# Patient Record
Sex: Male | Born: 1970 | Race: Black or African American | Hispanic: No | Smoking: Never smoker
Health system: Southern US, Community
[De-identification: ages and names within clinical notes are randomized; demographics above are authoritative.]

## PROBLEM LIST (undated history)

## (undated) DIAGNOSIS — K200 Eosinophilic esophagitis: Secondary | ICD-10-CM

## (undated) DIAGNOSIS — R1310 Dysphagia, unspecified: Secondary | ICD-10-CM

## (undated) DIAGNOSIS — G4730 Sleep apnea, unspecified: Secondary | ICD-10-CM

## (undated) DIAGNOSIS — K2970 Gastritis, unspecified, without bleeding: Secondary | ICD-10-CM

## (undated) HISTORY — DX: Gastritis, unspecified, without bleeding: K29.70

## (undated) HISTORY — DX: Sleep apnea, unspecified: G47.30

---

## 2004-11-09 ENCOUNTER — Emergency Department (HOSPITAL_COMMUNITY): Admission: EM | Admit: 2004-11-09 | Discharge: 2004-11-09 | Payer: Self-pay | Admitting: Family Medicine

## 2005-09-17 ENCOUNTER — Encounter: Admission: RE | Admit: 2005-09-17 | Discharge: 2005-09-17 | Payer: Self-pay | Admitting: Occupational Medicine

## 2007-09-13 ENCOUNTER — Emergency Department (HOSPITAL_COMMUNITY): Admission: EM | Admit: 2007-09-13 | Discharge: 2007-09-13 | Payer: Self-pay | Admitting: Emergency Medicine

## 2007-10-02 ENCOUNTER — Ambulatory Visit (HOSPITAL_COMMUNITY): Admission: RE | Admit: 2007-10-02 | Discharge: 2007-10-02 | Payer: Self-pay | Admitting: Chiropractic Medicine

## 2015-08-12 ENCOUNTER — Encounter (HOSPITAL_COMMUNITY): Payer: Self-pay | Admitting: *Deleted

## 2015-08-12 ENCOUNTER — Emergency Department (HOSPITAL_COMMUNITY): Payer: BLUE CROSS/BLUE SHIELD

## 2015-08-12 DIAGNOSIS — K529 Noninfective gastroenteritis and colitis, unspecified: Secondary | ICD-10-CM | POA: Insufficient documentation

## 2015-08-12 DIAGNOSIS — R05 Cough: Secondary | ICD-10-CM | POA: Insufficient documentation

## 2015-08-12 DIAGNOSIS — R1030 Lower abdominal pain, unspecified: Secondary | ICD-10-CM | POA: Diagnosis present

## 2015-08-12 DIAGNOSIS — R000 Tachycardia, unspecified: Secondary | ICD-10-CM | POA: Diagnosis not present

## 2015-08-12 LAB — BASIC METABOLIC PANEL WITH GFR
Anion gap: 12 (ref 5–15)
BUN: 15 mg/dL (ref 6–20)
CO2: 20 mmol/L — ABNORMAL LOW (ref 22–32)
Calcium: 9.5 mg/dL (ref 8.9–10.3)
Chloride: 108 mmol/L (ref 101–111)
Creatinine, Ser: 1.03 mg/dL (ref 0.61–1.24)
GFR calc Af Amer: >60 mL/min
GFR calc non Af Amer: >60 mL/min
Glucose, Bld: 112 mg/dL — ABNORMAL HIGH (ref 65–99)
Potassium: 4.1 mmol/L (ref 3.5–5.1)
Sodium: 140 mmol/L (ref 135–145)

## 2015-08-12 LAB — CBC
HCT: 47.7 % (ref 39.0–52.0)
Hemoglobin: 15.6 g/dL (ref 13.0–17.0)
MCH: 28.5 pg (ref 26.0–34.0)
MCHC: 32.7 g/dL (ref 30.0–36.0)
MCV: 87 fL (ref 78.0–100.0)
PLATELETS: 263 10*3/uL (ref 150–400)
RBC: 5.48 MIL/uL (ref 4.22–5.81)
RDW: 13.5 % (ref 11.5–15.5)
WBC: 8.3 10*3/uL (ref 4.0–10.5)

## 2015-08-12 LAB — I-STAT TROPONIN, ED: Troponin i, poc: 0 ng/mL (ref 0.00–0.08)

## 2015-08-12 LAB — I-STAT CG4 LACTIC ACID, ED: LACTIC ACID, VENOUS: 1.03 mmol/L (ref 0.5–2.0)

## 2015-08-12 NOTE — ED Notes (Signed)
Pt states started having diarrhea and lower abdominal pain, then vomiting, then coughing and wheezing.  Pt reports chest pain more with vomiting.  HR resting ins 123

## 2015-08-13 ENCOUNTER — Emergency Department (HOSPITAL_COMMUNITY)
Admission: EM | Admit: 2015-08-13 | Discharge: 2015-08-13 | Disposition: A | Discharge status: Home / Self Care | Payer: BLUE CROSS/BLUE SHIELD | Attending: Emergency Medicine | Admitting: Emergency Medicine

## 2015-08-13 DIAGNOSIS — K529 Noninfective gastroenteritis and colitis, unspecified: Secondary | ICD-10-CM

## 2015-08-13 LAB — I-STAT CG4 LACTIC ACID, ED: Lactic Acid, Venous: 0.82 mmol/L (ref 0.5–2.0)

## 2015-08-13 MED ORDER — ONDANSETRON HCL 4 MG/2ML IJ SOLN
4.0000 mg | Freq: Once | INTRAMUSCULAR | Status: AC
  Administered 2015-08-13: 4 mg via INTRAVENOUS
  Filled 2015-08-13: qty 2

## 2015-08-13 MED ORDER — LOPERAMIDE HCL 2 MG PO CAPS
4.0000 mg | ORAL_CAPSULE | Freq: Once | ORAL | Status: AC
  Administered 2015-08-13: 4 mg via ORAL
  Filled 2015-08-13: qty 2

## 2015-08-13 MED ORDER — LOPERAMIDE HCL 2 MG PO CAPS: 4.0000 mg | ORAL_CAPSULE | Freq: Once | ORAL | Status: DC

## 2015-08-13 MED ORDER — SODIUM CHLORIDE 0.9 % IV BOLUS (SEPSIS)
1000.0000 mL | Freq: Once | INTRAVENOUS | Status: AC
  Administered 2015-08-13: 1000 mL via INTRAVENOUS

## 2015-08-13 MED ORDER — ONDANSETRON 4 MG PO TBDP: 4.0000 mg | ORAL_TABLET | Freq: Three times a day (TID) | ORAL | Status: DC | PRN

## 2015-08-13 NOTE — ED Provider Notes (Signed)
CSN: [id number]     Arrival date & time 08/12/15  1720 History   By signing my name below, I, Arlan OrganAshley Leger, attest that this documentation has been prepared under the direction and in the presence of Shon Batonourtney F Elke Holtry, MD.  Electronically Signed: Arlan OrganAshley Leger, ED Scribe. 08/13/2015. 1:19 AM.   Chief Complaint  Patient presents with  . Abdominal Pain  . Diarrhea  . Emesis  . Chest Pain   The history is provided by the patient. No language interpreter was used.    HPI Comments: Tonette LedererShawn Mishra is a 45 y.o. male without any pertinent past medical history who presents to the Emergency Department complaining of constant, ongoing lower abdominal pain onset last night. Pain is described at tightness/cramping. He also reports nausea, vomiting, cough, and wheezing. Last episode of nausea/diarrhea earlier this afternoon. No aggravating or alleviating factors reported. No OTC medications or home remedies attempted prior to arrival. No recent fever, chills, chest pain, or shortness of breath. Pt was evaluated at Urgent Care earlier for same. He was discharged and advised to come to the Emergency Department as his HR was high. Pt denies any known sick contacts.   PCP: No PCP Per Patient    History reviewed. No pertinent past medical history. History reviewed. No pertinent past surgical history. No family history on file. Social History  Substance Use Topics  . Smoking status: Never Smoker   . Smokeless tobacco: None  . Alcohol Use: Yes     Comment: occ    Review of Systems  Constitutional: Negative for fever and chills.  Respiratory: Positive for cough. Negative for wheezing.   Cardiovascular: Negative for chest pain.  Gastrointestinal: Positive for nausea, vomiting, abdominal pain and diarrhea.  Neurological: Negative for headaches.  Psychiatric/Behavioral: Negative for confusion.  All other systems reviewed and are negative.     Allergies  Other  Home Medications   Prior to Admission  medications   Medication Sig Start Date End Date Taking? Authorizing Provider  loperamide (IMODIUM) 2 MG capsule Take 2 capsules (4 mg total) by mouth once. 08/13/15   Shon Batonourtney F Ileane Sando, MD  ondansetron (ZOFRAN ODT) 4 MG disintegrating tablet Take 1 tablet (4 mg total) by mouth every 8 (eight) hours as needed for nausea or vomiting. 08/13/15   Shon Batonourtney F Derelle Cockrell, MD   Triage Vitals: BP 125/81 mmHg  Pulse 81  Temp(Src) 100 F (37.8 C) (Oral)  Resp 17  SpO2 98%   Physical Exam  Constitutional: He is oriented to person, place, and time. He appears well-developed and well-nourished. No distress.  HENT:  Head: Normocephalic and atraumatic.  Cardiovascular: Normal rate, regular rhythm and normal heart sounds.   No murmur heard. Pulmonary/Chest: Effort normal and breath sounds normal. No respiratory distress. He has no wheezes.  Abdominal: Soft. Bowel sounds are normal. There is no tenderness. There is no rebound.  Musculoskeletal: He exhibits no edema.  Neurological: He is alert and oriented to person, place, and time.  Skin: Skin is warm and dry.  Psychiatric: He has a normal mood and affect.  Nursing note and vitals reviewed.   ED Course  Procedures (including critical care time)  DIAGNOSTIC STUDIES: Oxygen Saturation is 96% on RA, adequate by my interpretation.    COORDINATION OF CARE: 1:14 AM- Will give fluids. Will order CXR, i-stat CG4 lactic acid, BMP, CBC, I-stat troponin, and EKG. Discussed treatment plan with pt at bedside and pt agreed to plan.     Labs Review Labs Reviewed  BASIC METABOLIC PANEL - Abnormal; Notable for the following:    CO2 20 (*)    Glucose, Bld 112 (*)    All other components within normal limits  CBC  I-STAT TROPOININ, ED  I-STAT CG4 LACTIC ACID, ED  I-STAT CG4 LACTIC ACID, ED    Imaging Review Dg Chest 2 View  08/12/2015  CLINICAL DATA:  Chest pain EXAM: CHEST  2 VIEW COMPARISON:  None. FINDINGS: Normal heart size. Lungs are under aerated with  bibasilar atelectasis. No pneumothorax. No pleural effusion. IMPRESSION: Bibasilar atelectasis. Electronically Signed   By: Jolaine Click M.D.   On: 08/12/2015 18:19   I have personally reviewed and evaluated these images and lab results as part of my medical decision-making.   EKG Interpretation   Date/Time:  Tuesday August 12 2015 17:28:59 EDT Ventricular Rate:  123 PR Interval:  120 QRS Duration: 80 QT Interval:  296 QTC Calculation: 423 R Axis:   66 Text Interpretation:  Sinus tachycardia Otherwise normal ECG Confirmed by  Lincoln Brigham [id number]) on 08/12/2015 5:36:25 PM      MDM   Final diagnoses:  Gastroenteritis    Patient presents with vomiting, diarrhea, and abdominal pain. Endorse chest pain earlier but denies at this time. EKG was sinus tachycardia and plain films negative. Was noted to be tachycardic initially but improved on my evaluation. He is currently nontoxic. No focal abdominal tenderness. Given vomiting and diarrhea, suspect gastroenteritis with subsequent dehydration. Does have a low-grade temperature of 100.1. Basic labwork is reassuring. Patient was given fluids, Zofran, and Imodium. He reports improvement of his symptoms. He is able to tolerate fluids. Will discharge him with symptom control.  After history, exam, and medical workup I feel the patient has been appropriately medically screened and is safe for discharge home. Pertinent diagnoses were discussed with the patient. Patient was given return precautions.  I personally performed the services described in this documentation, which was scribed in my presence. The recorded information has been reviewed and is accurate.    Shon Baton, MD 08/13/15 [phone]

## 2015-08-13 NOTE — ED Notes (Signed)
Pt left with all his belongings and ambulated out of the treatment area.  

## 2015-08-13 NOTE — Discharge Instructions (Signed)

## 2016-02-19 DIAGNOSIS — M545 Low back pain, unspecified: Secondary | ICD-10-CM | POA: Insufficient documentation

## 2017-06-24 ENCOUNTER — Encounter: Payer: Self-pay | Admitting: Gastroenterology

## 2017-07-21 NOTE — Progress Notes (Signed)
[facility] Consult Note:  HistoryPepper Glover 07/22/2017  Referring physician: Bernadette Hoit, MD  Reason for consult/chief complaint: Dysphagia (onset x years, solids, has gotten choked.)   Subjective  HPI:  This patient was referred by primary care for about 10 years of intermittent solid food dysphagia. It occurs several times a week, usually meat will feel stuck in the neck and not to bring it back up.  It does not occur with liquids, but occasionally just with pills such as ibuprofen.  He denies any chronic allergic symptoms or skin rash.  He denies nausea, vomiting, early satiety, weight loss, change in bowel habits or rectal bleeding  He also rarely gets heartburn, and only then with certain offending foods. ROS:  Review of Systems  Constitutional: Negative for appetite change and unexpected weight change.  HENT: Negative for mouth sores and voice change.   Eyes: Negative for pain and redness.  Respiratory: Negative for cough and shortness of breath.   Cardiovascular: Negative for chest pain and palpitations.  Genitourinary: Negative for dysuria and hematuria.  Musculoskeletal: Negative for arthralgias and myalgias.  Skin: Negative for pallor and rash.  Neurological: Negative for weakness and headaches.  Hematological: Negative for adenopathy.     Past Medical History: History reviewed. No pertinent past medical history.  No chronic medical problems  Past Surgical History: Past Surgical History:  Procedure Laterality Date  . NO PAST SURGERIES       Family History: Family History  Problem Relation Age of Onset  . Pancreatic cancer Maternal Grandmother     Social History: Social History   Socioeconomic History  . Marital status: Married    Spouse name: None  . Number of children: 2  . Years of education: None  . Highest education level: None  Social Needs  . Financial resource strain: None  . Food insecurity - worry: None  .  Food insecurity - inability: None  . Transportation needs - medical: None  . Transportation needs - non-medical: None  Occupational History  . 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 Smoking status: Never Smoker  . Smokeless tobacco: Never Used  Substance and Sexual Activity  . Alcohol use: Yes    Comment: occ  . Drug use: No  . Sexual activity: None  Other Topics Concern  . None  Social History Narrative  . None    Allergies: Allergies  Allergen Reactions  . Other Anaphylaxis    Fresh fish, can't breath    Outpatient Meds: No current outpatient medications on file.   No current facility-administered medications for this visit.       ___________________________________________________________________ Objective   Exam:  BP 130/72   Pulse 82   Ht 6' (1.829 m)   Wt 241 lb (109.3 kg)   BMI 32.69 kg/m    General: this is a(n) well-appearing man, normal vocal quality  Eyes: sclera anicteric, no redness  ENT: oral mucosa moist without lesions, no cervical or supraclavicular lymphadenopathy, good dentition  CV: RRR without murmur, S1/S2, no JVD, no peripheral edema  Resp: clear to auscultation bilaterally, normal RR and effort noted  GI: soft, no tenderness, with active bowel sounds. No guarding or palpable organomegaly noted.  Skin; warm and dry, no rash or jaundice noted  Neuro: awake, alert and oriented x 3. Normal gross motor function and fluent speech  Labs:  No data for review  Assessment: Encounter Diagnosis  Name Primary?  . Esophageal dysphagia Yes  This sounds most likely to be a mechanical cause of dysphagia.  I recommended upper endoscopy with probable dilation, and he is agreeable. Eosinophilic esophagitis or motility disorder seem less likely.   Thank you for the courtesy of this consult.  Please call me with any questions or concerns.  Jeffrey Glover  CC: Bernadette Hoitafaela Aguiar, MD

## 2017-07-22 ENCOUNTER — Ambulatory Visit (INDEPENDENT_AMBULATORY_CARE_PROVIDER_SITE_OTHER): Payer: BLUE CROSS/BLUE SHIELD | Admitting: Gastroenterology

## 2017-07-22 ENCOUNTER — Encounter: Payer: Self-pay | Admitting: Gastroenterology

## 2017-07-22 VITALS — BP 130/72 | HR 82 | Ht 72.0 in | Wt 241.0 lb

## 2017-07-22 DIAGNOSIS — R1319 Other dysphagia: Secondary | ICD-10-CM

## 2017-07-22 DIAGNOSIS — R1310 Dysphagia, unspecified: Secondary | ICD-10-CM

## 2017-07-22 NOTE — Patient Instructions (Signed)
If you are age 46 or older, your body mass index should be between 23-30. Your Body mass index is 32.69 kg/m. If this is out of the aforementioned range listed, please consider follow up with your Primary Care Provider.  If you are age 46 or younger, your body mass index should be between 19-25. Your Body mass index is 32.69 kg/m. If this is out of the aformentioned range listed, please consider follow up with your Primary Care Provider.   You have been scheduled for an endoscopy. Please follow written instructions given to you at your visit today. If you use inhalers (even only as needed), please bring them with you on the day of your procedure. Your physician has requested that you go to www.startemmi.com and enter the access code given to you at your visit today. This web site gives a general overview about your procedure. However, you should still follow specific instructions given to you by our office regarding your preparation for the procedure.  Thank you for choosing [facility]  Dr Amada JupiterHenry Danis III

## 2017-07-25 ENCOUNTER — Encounter: Payer: Self-pay | Admitting: Gastroenterology

## 2017-08-03 ENCOUNTER — Telehealth: Payer: Self-pay | Admitting: Gastroenterology

## 2017-08-03 ENCOUNTER — Ambulatory Visit (AMBULATORY_SURGERY_CENTER): Payer: BLUE CROSS/BLUE SHIELD | Admitting: Gastroenterology

## 2017-08-03 ENCOUNTER — Encounter: Payer: Self-pay | Admitting: Gastroenterology

## 2017-08-03 VITALS — BP 151/103 | HR 94 | Temp 99.6°F | Resp 16 | Ht 72.0 in | Wt 241.0 lb

## 2017-08-03 DIAGNOSIS — K209 Esophagitis, unspecified: Secondary | ICD-10-CM | POA: Diagnosis not present

## 2017-08-03 DIAGNOSIS — R1310 Dysphagia, unspecified: Secondary | ICD-10-CM | POA: Diagnosis present

## 2017-08-03 DIAGNOSIS — K228 Other specified diseases of esophagus: Secondary | ICD-10-CM

## 2017-08-03 DIAGNOSIS — R1319 Other dysphagia: Secondary | ICD-10-CM

## 2017-08-03 HISTORY — PX: UPPER GI ENDOSCOPY: SHX6162

## 2017-08-03 MED ORDER — SODIUM CHLORIDE 0.9 % IV SOLN: 500.0000 mL | Freq: Once | INTRAVENOUS | Status: DC

## 2017-08-03 NOTE — Op Note (Signed)
[facility] Patient Name: Jeffrey Glover Procedure Date: 08/03/2017 2:50 PM MRN: [id number] Endoscopist: Sherilyn Cooter L. Myrtie Neither , MD Age: 47 Referring MD:  Date of Birth: [birth year] Gender: Male Account #: [id number] Procedure:                Upper GI endoscopy Indications:              Dysphagia Medicines:                Monitored Anesthesia Care Procedure:                Pre-Anesthesia Assessment:                           - Prior to the procedure, a History and Physical                            was performed, and patient medications and                            allergies were reviewed. The patient's tolerance of                            previous anesthesia was also reviewed. The risks                            and benefits of the procedure and the sedation                            options and risks were discussed with the patient.                            All questions were answered, and informed consent                            was obtained. Prior Anticoagulants: The patient has                            taken no previous anticoagulant or antiplatelet                            agents. ASA Grade Assessment: II - A patient with                            mild systemic disease. After reviewing the risks                            and benefits, the patient was deemed in                            satisfactory condition to undergo the procedure.                           After obtaining informed consent, the endoscope was  passed under direct vision. Throughout the                            procedure, the patient's blood pressure, pulse, and                            oxygen saturations were monitored continuously. The                            Model GIF-[id number] scope was introduced                            through the mouth, and advanced to the upper third                            of esophagus. The upper GI endoscopy was  performed                            with difficulty due to narrowing. The patient                            tolerated the procedure well. Scope In: Scope Out: Findings:                 The larynx was normal.                           Mucosal changes including ringed esophagus, feline                            appearance, longitudinal furrows and small-caliber                            esophagus were found in the upper third of the                            esophagus. Biopsies were taken with a cold forceps                            for histology. The standard adult EGD scope (9.76mm                            diameter) could not be advanced this point due to                            the degree of narrowing. Complications:            No immediate complications. Estimated Blood Loss:     Estimated blood loss was minimal. Impression:               - Normal larynx.                           - Esophageal mucosal changes suggestive of  eosinophilic esophagitis. Biopsied. Recommendation:           - Await pathology results.                           - Perform an esophagram at appointment to be                            scheduled.                           - Return to my office after studies are complete.                           - Soft diet.                           - Continue present medications.                           - Patient has a contact number available for                            emergencies. The signs and symptoms of potential                            delayed complications were discussed with the                            patient. Return to normal activities tomorrow.                            Written discharge instructions were provided to the                            patient. Xayne Brumbaugh L. Myrtie Neitheranis, MD 08/03/2017 3:08:31 PM This report has been signed electronically.

## 2017-08-03 NOTE — Progress Notes (Signed)
Called to room to assist during endoscopic procedure.  Patient ID and intended procedure confirmed with present staff. Received instructions for my participation in the procedure from the performing physician.  

## 2017-08-03 NOTE — Patient Instructions (Addendum)
YOU HAD AN ENDOSCOPIC PROCEDURE TODAY AT THE [facility]:   Refer to the procedure report that was given to you for any specific questions about what was found during the examination.  If the procedure report does not answer your questions, please call your gastroenterologist to clarify.  If you requested that your care partner not be given the details of your procedure findings, then the procedure report has been included in a sealed envelope for you to review at your convenience later.   Following upper endoscopy (EGD)  Vomiting of blood or coffee ground material  New chest pain or pain under the shoulder blades  Painful or persistently difficult swallowing  New shortness of breath  Fever of 100F or higher  Black, tarry-looking stools  For urgent or emergent issues, a gastroenterologist can be reached at any hour by calling [phone].    Please see handouts given to you on Stricture and Soft diet. For urgent or emergent issues, a gastroenterologist can be reached at any hour by calling [phone].   DIET:  Dr. Myrtie Neither wants you to follow a soft diet for now.  Drink plenty of fluids but you should avoid alcoholic beverages for 24 hours.  ACTIVITY:  You should plan to take it easy for the rest of today and you should NOT DRIVE or use heavy machinery until tomorrow (because of the sedation medicines used during the test).    FOLLOW UP: Our staff will call the number listed on your records the next business day following your procedure to check on you and address any questions or concerns that you may have regarding the information given to you following your procedure. If we do not reach you, we will leave a message.  However, if you are feeling well and you are not experiencing any problems, there is no need to return our call.  We will assume that you have returned to your regular daily activities without incident.  If any biopsies were taken you will be contacted by  phone or by letter within the next 1-3 weeks.  Please call us at [phone] if you have not heard about the biopsies in 3 weeks.    SIGNATURES/CONFIDENTIALITY: You and/or your care partner have signed paperwork which will be entered into your electronic medical record.  These signatures attest to the fact that that the information above on your After Visit Summary has been reviewed and is understood.  Full responsibility of the confidentiality of this discharge information lies with you and/or your care-partner.  Thank you for letting us take care of your healthcare needs today.

## 2017-08-03 NOTE — Progress Notes (Signed)
Pt's states no medical or surgical changes since previsit or office visit. 

## 2017-08-03 NOTE — Progress Notes (Signed)
Report given to PACU, vss 

## 2017-08-03 NOTE — Telephone Encounter (Signed)
Please arrange a barium swallow (for dysphagia and esophageal stricture on EGD) and an office visit to follow afterwards.  Late day OK

## 2017-08-04 ENCOUNTER — Telehealth: Payer: Self-pay | Admitting: *Deleted

## 2017-08-04 ENCOUNTER — Other Ambulatory Visit: Payer: Self-pay

## 2017-08-04 DIAGNOSIS — K222 Esophageal obstruction: Secondary | ICD-10-CM

## 2017-08-04 DIAGNOSIS — R1310 Dysphagia, unspecified: Secondary | ICD-10-CM

## 2017-08-04 NOTE — Telephone Encounter (Signed)
Left message on f/u call 

## 2017-08-04 NOTE — Telephone Encounter (Signed)
Patient is scheduled for 08-15-2017 @ 930 am . Jeffrey Glover has been notified. Info sheet with date, time and instructions has been mailed to home address on file.

## 2017-08-04 NOTE — Telephone Encounter (Signed)
  Follow up Call-  Call back number 08/03/2017  Post procedure Call Back phone  # [phone]  Permission to leave phone message Yes  Some recent data might be hidden     Patient questions:  Do you have a fever, pain , or abdominal swelling? No. Pain Score  0 *  Have you tolerated food without any problems? Yes.    Have you been able to return to your normal activities? Yes.    Do you have any questions about your discharge instructions: Diet   No. Medications  No. Follow up visit  No.  Do you have questions or concerns about your Care? No.  Actions: * If pain score is 4 or above: No action needed, pain <4.

## 2017-08-15 ENCOUNTER — Ambulatory Visit (HOSPITAL_COMMUNITY)
Admission: RE | Admit: 2017-08-15 | Discharge: 2017-08-15 | Disposition: A | Discharge status: Home / Self Care | Payer: BLUE CROSS/BLUE SHIELD | Source: Ambulatory Visit | Attending: Gastroenterology | Admitting: Gastroenterology

## 2017-08-15 DIAGNOSIS — K222 Esophageal obstruction: Secondary | ICD-10-CM | POA: Diagnosis present

## 2017-08-15 DIAGNOSIS — R1310 Dysphagia, unspecified: Secondary | ICD-10-CM | POA: Insufficient documentation

## 2017-08-15 DIAGNOSIS — K449 Diaphragmatic hernia without obstruction or gangrene: Secondary | ICD-10-CM | POA: Diagnosis not present

## 2017-08-22 ENCOUNTER — Telehealth: Payer: Self-pay | Admitting: Gastroenterology

## 2017-08-22 ENCOUNTER — Other Ambulatory Visit: Payer: Self-pay

## 2017-08-22 DIAGNOSIS — K222 Esophageal obstruction: Secondary | ICD-10-CM

## 2017-08-22 NOTE — Telephone Encounter (Signed)
See result note.  

## 2017-08-22 NOTE — Telephone Encounter (Signed)
Pt looking for results of barium.

## 2017-08-26 ENCOUNTER — Encounter (HOSPITAL_COMMUNITY): Payer: Self-pay | Admitting: *Deleted

## 2017-08-26 ENCOUNTER — Telehealth: Payer: Self-pay

## 2017-08-26 ENCOUNTER — Other Ambulatory Visit: Payer: Self-pay

## 2017-08-26 NOTE — Telephone Encounter (Signed)
Per Dr.Danis, moved patient's procedure from 5/6 to 4/9 at [facility]. Patient given new prep instructions verbally. Sent Amy H a message alerting her to pre-certing his procedure for new date.

## 2017-08-30 ENCOUNTER — Encounter (HOSPITAL_COMMUNITY): Payer: Self-pay

## 2017-08-30 ENCOUNTER — Ambulatory Visit (HOSPITAL_COMMUNITY)
Admission: RE | Admit: 2017-08-30 | Discharge: 2017-08-30 | Disposition: A | Discharge status: Home / Self Care | Payer: BLUE CROSS/BLUE SHIELD | Source: Ambulatory Visit | Attending: Gastroenterology | Admitting: Gastroenterology

## 2017-08-30 ENCOUNTER — Ambulatory Visit (HOSPITAL_COMMUNITY): Payer: BLUE CROSS/BLUE SHIELD | Admitting: Anesthesiology

## 2017-08-30 ENCOUNTER — Other Ambulatory Visit: Payer: Self-pay

## 2017-08-30 ENCOUNTER — Encounter (HOSPITAL_COMMUNITY)
Admission: RE | Disposition: A | Discharge status: Home / Self Care | Payer: Self-pay | Source: Ambulatory Visit | Attending: Gastroenterology

## 2017-08-30 ENCOUNTER — Ambulatory Visit (HOSPITAL_COMMUNITY): Payer: BLUE CROSS/BLUE SHIELD

## 2017-08-30 DIAGNOSIS — Z791 Long term (current) use of non-steroidal anti-inflammatories (NSAID): Secondary | ICD-10-CM | POA: Insufficient documentation

## 2017-08-30 DIAGNOSIS — K222 Esophageal obstruction: Secondary | ICD-10-CM

## 2017-08-30 DIAGNOSIS — R1310 Dysphagia, unspecified: Secondary | ICD-10-CM | POA: Insufficient documentation

## 2017-08-30 DIAGNOSIS — K200 Eosinophilic esophagitis: Secondary | ICD-10-CM | POA: Diagnosis not present

## 2017-08-30 HISTORY — DX: Dysphagia, unspecified: R13.10

## 2017-08-30 HISTORY — PX: ESOPHAGOGASTRODUODENOSCOPY (EGD) WITH PROPOFOL: SHX5813

## 2017-08-30 SURGERY — ESOPHAGOGASTRODUODENOSCOPY (EGD) WITH PROPOFOL
Anesthesia: Monitor Anesthesia Care

## 2017-08-30 MED ORDER — PROPOFOL 10 MG/ML IV BOLUS
INTRAVENOUS | Status: DC | PRN
  Administered 2017-08-30: 20 mg via INTRAVENOUS
  Administered 2017-08-30: 40 mg via INTRAVENOUS

## 2017-08-30 MED ORDER — LACTATED RINGERS IV SOLN
INTRAVENOUS | Status: DC
  Administered 2017-08-30: 1000 mL via INTRAVENOUS

## 2017-08-30 MED ORDER — PROPOFOL 500 MG/50ML IV EMUL
INTRAVENOUS | Status: DC | PRN
  Administered 2017-08-30: 200 ug/kg/min via INTRAVENOUS

## 2017-08-30 MED ORDER — PROPOFOL 10 MG/ML IV BOLUS
INTRAVENOUS | Status: AC
  Filled 2017-08-30: qty 20

## 2017-08-30 MED ORDER — LIDOCAINE 2% (20 MG/ML) 5 ML SYRINGE
INTRAMUSCULAR | Status: DC | PRN
  Administered 2017-08-30: 100 mg via INTRAVENOUS

## 2017-08-30 MED ORDER — SODIUM CHLORIDE 0.9 % IV SOLN: INTRAVENOUS | Status: DC

## 2017-08-30 MED ORDER — PROPOFOL 10 MG/ML IV BOLUS
INTRAVENOUS | Status: AC
  Filled 2017-08-30: qty 40

## 2017-08-30 MED ORDER — ONDANSETRON HCL 4 MG/2ML IJ SOLN
INTRAMUSCULAR | Status: DC | PRN
  Administered 2017-08-30: 4 mg via INTRAVENOUS

## 2017-08-30 SURGICAL SUPPLY — 15 items

## 2017-08-30 NOTE — H&P (Signed)
History:  This patient presents for endoscopic testing for esophageal stricture and dysphagia.  Tonette LedererShawn Malatesta Referring physician: Patient, No Pcp Per  Past Medical History: Past Medical History:  Diagnosis Date  . Dysphagia      Past Surgical History: Past Surgical History:  Procedure Laterality Date  . UPPER GI ENDOSCOPY  08/03/2017    Allergies: Allergies  Allergen Reactions  . Other Anaphylaxis and Other (See Comments)    Fresh fish, can't breath    Outpatient Meds: Current Facility-Administered Medications  Medication Dose Route Frequency Provider Last Rate Last Dose  . 0.9 %  sodium chloride infusion   Intravenous Continuous Danis, Starr LakeHenry L III, MD      . lactated ringers infusion   Intravenous Continuous Charlie Pitteranis, Henry L III, MD 10 mL/hr at 08/30/17 0847 1,000 mL at 08/30/17 0847      ___________________________________________________________________ Objective   Exam:  BP (!) 156/96   Pulse 79   Temp 98.9 F (37.2 C) (Oral)   Resp 17   Ht 6' (1.829 m)   Wt 241 lb (109.3 kg)   SpO2 97%   BMI 32.69 kg/m    CV: RRR without murmur, S1/S2, no JVD, no peripheral edema  Resp: clear to auscultation bilaterally, normal RR and effort noted  GI: soft, no tenderness, with active bowel sounds. No guarding or palpable organomegaly noted.  Neuro: awake, alert and oriented x 3. Normal gross motor function and fluent speech   Assessment:  Esophageal stricture and dysphagia  Plan:  EGD with dilation of stricture   Charlie PitterHenry L Danis III

## 2017-08-30 NOTE — Anesthesia Preprocedure Evaluation (Signed)
Anesthesia Evaluation  Patient identified by MRN, date of birth, ID band Patient awake    Reviewed: Allergy & Precautions, NPO status   Airway Mallampati: I  TM Distance: >3 FB Neck ROM: Full    Dental no notable dental hx. (+) Teeth Intact   Pulmonary neg pulmonary ROS,    Pulmonary exam normal breath sounds clear to auscultation       Cardiovascular negative cardio ROS   Rhythm:Regular Rate:Normal     Neuro/Psych negative neurological ROS     GI/Hepatic Neg liver ROS,   Endo/Other  negative endocrine ROS  Renal/GU negative Renal ROS  negative genitourinary   Musculoskeletal negative musculoskeletal ROS (+)   Abdominal (+) + obese,   Peds  Hematology negative hematology ROS (+)   Anesthesia Other Findings   Reproductive/Obstetrics                             Anesthesia Physical Anesthesia Plan  ASA: I  Anesthesia Plan: MAC   Post-op Pain Management:    Induction:   PONV Risk Score and Plan: 1 and Propofol infusion  Airway Management Planned: Natural Airway and Simple Face Mask  Additional Equipment:   Intra-op Plan:   Post-operative Plan:   Informed Consent: I have reviewed the patients History and Physical, chart, labs and discussed the procedure including the risks, benefits and alternatives for the proposed anesthesia with the patient or authorized representative who has indicated his/her understanding and acceptance.   Dental advisory given  Plan Discussed with: CRNA  Anesthesia Plan Comments:         Anesthesia Quick Evaluation

## 2017-08-30 NOTE — Anesthesia Postprocedure Evaluation (Signed)
Anesthesia Post Note  Patient: Jeffrey Glover  Procedure(s) Performed: ESOPHAGOGASTRODUODENOSCOPY (EGD) WITH PROPOFOL (N/A )     Patient location during evaluation: PACU Anesthesia Type: MAC Level of consciousness: awake Pain management: pain level controlled Vital Signs Assessment: post-procedure vital signs reviewed and stable Respiratory status: spontaneous breathing Cardiovascular status: stable Postop Assessment: no apparent nausea or vomiting Anesthetic complications: no    Last Vitals:  Vitals:   08/30/17 1055 08/30/17 1100  BP: (!) 147/93 (!) 159/98  Pulse: 87 85  Resp: 20 16  Temp:    SpO2: 94% 94%    Last Pain:  Vitals:   08/30/17 1045  TempSrc:   PainSc: 0-No pain   Pain Goal:                 Svara Twyman JR,JOHN Kissa Campoy

## 2017-08-30 NOTE — Discharge Instructions (Signed)

## 2017-08-30 NOTE — Op Note (Signed)
[facility] Patient Name: Jeffrey Glover Procedure Date: 08/30/2017 MRN: [id number] Attending MD: Estill Cotta. Loletha Carrow , MD Date of Birth: Oct 04, 1970 CSN: [id number] Age: 47 Admit Type: Outpatient Procedure:                Upper GI endoscopy Indications:              Dysphagia Providers:                Mallie Mussel L. Loletha Carrow, MD, Cleda Daub, RN, Cherylynn Ridges, Technician, Virgia Land, CRNA Referring MD:              Medicines:                Monitored Anesthesia Care Complications:            No immediate complications. Estimated Blood Loss:     Estimated blood loss was minimal. Procedure:                Pre-Anesthesia Assessment:                           - Prior to the procedure, a History and Physical                            was performed, and patient medications and                            allergies were reviewed. The patient's tolerance of                            previous anesthesia was also reviewed. The risks                            and benefits of the procedure and the sedation                            options and risks were discussed with the patient.                            All questions were answered, and informed consent                            was obtained. Prior Anticoagulants: The patient has                            taken no previous anticoagulant or antiplatelet                            agents. ASA Grade Assessment: II - A patient with                            mild systemic disease. After reviewing the risks  and benefits, the patient was deemed in                            satisfactory condition to undergo the procedure.                           After obtaining informed consent, the endoscope was                            passed under direct vision. Throughout the                            procedure, the patient's blood pressure, pulse, and                            oxygen  saturations were monitored continuously. The                            Endoscope was introduced through the mouth, and                            advanced to the upper third of esophagus. The upper                            GI endoscopy was performed with difficulty due to                            narrowing and copious secretions affecting airway                            management. The patient tolerated the procedure. Scope In: 10:01:19 AM Scope Out: 10:08:11 AM Total Procedure Duration: 0 hours 6 minutes 52 seconds  Findings:      Tne benign-appearing, intrinsic severe stenosis was found 23 cm from the       incisors. This stenosis measured 7 mm (inner diameter). The stenosis was       not traversed, however the proximal extent of it was intubated with the       70m (2.432mchannel) endoscope. This scope was unable to pass. Mucosal       changes of probable eosinophilic esophagitis were again seen, with       linear furrows, ringed appearance and diffusely narrowed caliber of the       visualized proximal to mid esophagus. Consideration was given to a       fluroscopy-guided balloon dilation. However, due to the length and       severity of the stricture, it was not clear that it could be safely and       effectively performed. Thus, no dilation was performed.      An examination of the stomach was not performed.      An examination of the duodenum was not performed. Impression:               - Benign-appearing esophageal stenosis. Biopsied. Moderate Sedation:      MAC sedation used Recommendation:           - Resume previous diet. However, meat  should be                            consumed sparingly and only in small amounts if                            ground while drinking plenty of fluids along with                            it.                           - Continue present medications.                           - Await pathology results.                           - Return  to my office after studies are complete. Procedure Code(s):        --- Professional ---                           [phone], Esophagoscopy, flexible, transoral; with                            biopsy, single or multiple Diagnosis Code(s):        --- Professional ---                           K22.2, Esophageal obstruction                           R13.10, Dysphagia, unspecified CPT copyright 2017 American Medical Association. All rights reserved. The codes documented in this report are preliminary and upon coder review may  be revised to meet current compliance requirements. Henry L. Loletha Carrow, MD 08/30/2017 10:44:51 AM This report has been signed electronically. Number of Addenda: 0

## 2017-08-30 NOTE — Transfer of Care (Signed)
Immediate Anesthesia Transfer of Care Note  Patient: Jeffrey Glover  Procedure(s) Performed: ESOPHAGOGASTRODUODENOSCOPY (EGD) WITH PROPOFOL (N/A )  Patient Location: PACU  Anesthesia Type:MAC  Level of Consciousness: awake, alert  and oriented  Airway & Oxygen Therapy: Patient Spontanous Breathing and Patient connected to face mask oxygen  Post-op Assessment: Report given to RN and Post -op Vital signs reviewed and stable  Post vital signs: Reviewed and stable  Last Vitals:  Vitals Value Taken Time  BP 155/94 08/30/2017 10:40 AM  Temp    Pulse 99 08/30/2017 10:40 AM  Resp 18 08/30/2017 10:40 AM  SpO2 93 % 08/30/2017 10:40 AM  Vitals shown include unvalidated device data.  Last Pain:  Vitals:   08/30/17 0831  TempSrc: Oral  PainSc: 0-No pain         Complications: No apparent anesthesia complications

## 2017-08-30 NOTE — Interval H&P Note (Signed)
History and Physical Interval Note:  08/30/2017 9:18 AM  Jeffrey Glover  has presented today for surgery, with the diagnosis of esophageal narrowing  The various methods of treatment have been discussed with the patient and family. After consideration of risks, benefits and other options for treatment, the patient has consented to  Procedure(s): ESOPHAGOGASTRODUODENOSCOPY (EGD) WITH PROPOFOL (N/A) SAVORY DILATION (N/A) as a surgical intervention .  The patient's history has been reviewed, patient examined, no change in status, stable for surgery.  I have reviewed the patient's chart and labs.  Questions were answered to the patient's satisfaction.     Charlie PitterHenry L Danis III

## 2017-08-31 ENCOUNTER — Encounter (HOSPITAL_COMMUNITY): Payer: Self-pay | Admitting: Gastroenterology

## 2017-10-12 ENCOUNTER — Encounter: Payer: Self-pay | Admitting: Gastroenterology

## 2017-10-12 ENCOUNTER — Ambulatory Visit (INDEPENDENT_AMBULATORY_CARE_PROVIDER_SITE_OTHER): Payer: BLUE CROSS/BLUE SHIELD | Admitting: Gastroenterology

## 2017-10-12 VITALS — BP 128/74 | HR 80 | Ht 72.0 in | Wt 238.4 lb

## 2017-10-12 DIAGNOSIS — R1319 Other dysphagia: Secondary | ICD-10-CM

## 2017-10-12 DIAGNOSIS — R1310 Dysphagia, unspecified: Secondary | ICD-10-CM

## 2017-10-12 DIAGNOSIS — K222 Esophageal obstruction: Secondary | ICD-10-CM | POA: Diagnosis not present

## 2017-10-12 DIAGNOSIS — K200 Eosinophilic esophagitis: Secondary | ICD-10-CM

## 2017-10-12 MED ORDER — LANSOPRAZOLE 30 MG PO TBDP: 30.0000 mg | ORAL_TABLET | Freq: Two times a day (BID) | ORAL | 3 refills | Status: DC

## 2017-10-12 NOTE — Progress Notes (Signed)
     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  Initial biopsies on first upper endoscopy showedEoE and when to repeat upper endoscopy was performed in hopes of performing a dilation, further biopsies were taken from deeper inside the stricture, and thses showed even more severe eosinophilic changes.,  Unfortunately, the degree in length of stenosis precluded dilation.  Jeffrey Glover has continued dysphagia to solids, especially with meat.  As before, he has infrequent heartburn.  His appetite is variable and sometimes he feels full easily.  His weight is been stable. He denies chronic rash or other hives or any other known food intolerances.  ROS: Cardiovascular:  no chest pain Respiratory: no dyspnea  The patient's Past Medical, Family and Social History were reviewed and are on file in the EMR.  Objective:  Med list reviewed  Current Outpatient Medications:  .  ibuprofen (ADVIL,MOTRIN) 200 MG tablet, Take 400 mg by mouth every 6 (six) hours as needed for headache or moderate pain., Disp: , Rfl:  .  lansoprazole (PREVACID SOLUTAB) 30 MG disintegrating tablet, Take 1 tablet (30 mg total) by mouth 2 (two) times daily before a meal., Disp: 60 tablet, Rfl: 3   Vital signs in last 24 hrs: Vitals:   10/12/17 0938  BP: 128/74  Pulse: 80    Physical Exam  He is well-appearing.  Normal vocal quality. No exam performed today.  Entire 20-minute visit spent in conversation as noted below.  Recent Labs:  Bx 08/30/17:  ESOPHAGEAL SQUAMOUS MUCOSA WITH MARKEDLY INCREASED INTRAEPITHELIAL EOSINOPHILS (UP TO 200/HPF) WITH DEGRANULATION, SUPERFICIAL LAYERING AND EOSINOPHILIC MICROABSCESSES, CONSISTENT WITH EOSINOPHILIC  ESOPHAGITIS  Radiologic studies:  See barium swallow results.  Images were personally reviewed prior to 2nd EGD.  @ Assessment: Encounter Diagnoses  Name Primary?  . Esophageal dysphagia Yes  . Esophageal stricture   . Eosinophilic esophagitis    Jeffrey Glover has severe eosinophilic esophagitis with a tight proximal esophageal stricture and dense eosinophilic infiltration.  He has had this condition for years based on this appearance and his reported symptoms.  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  We discussed eosinophilic esophagitis, its uncertain nature and limited available treatments. Before considering any additional attempt at stricture  dilation, I think he should have an allergy/immunology evaluation, a trial of high-dose PPI, and perhaps a trial of budesonide solution.  Again, given the duration and degree of this condition, I am uncertain how much benefit these medical therapies will offer, but I feel we should try.  Plan: Prevacid Solutab 30 mg twice daily before meals.  I do not think he will be able to get any tablet through the stricture. Referral to [facility] health allergy and immunology for dedicated allergy testing to see if there is any food or environmental allergen that could reasonably be eliminated in hopes of improving this problem.  Once I receive those reports, I will make plans to see him back and assess response and consider repeat upper endoscopy.   Jeffrey Glover

## 2017-10-12 NOTE — Patient Instructions (Addendum)
If you are age 46 or older, your body mass index should be between 23-30. Your Body mass index is 32.33 kg/m. If this is out of the aforementioned range listed, please consider follow up with your Primary Care Provider.  If you are age 36 or younger, your body mass index should be between 19-25. Your Body mass index is 32.33 kg/m. If this is out of the aformentioned range listed, please consider follow up with your Primary Care Provider.   [facility] health allergy group  [facility] [address] [place], [place] [address]  Office : [phone] Fax : [phone]  It was a pleasure to meet you today!  Dr. Myrtie Neither

## 2017-11-29 ENCOUNTER — Ambulatory Visit: Payer: Self-pay | Admitting: Allergy and Immunology

## 2019-03-07 DIAGNOSIS — G4710 Hypersomnia, unspecified: Secondary | ICD-10-CM | POA: Insufficient documentation

## 2019-03-07 DIAGNOSIS — R0683 Snoring: Secondary | ICD-10-CM | POA: Insufficient documentation

## 2020-10-27 ENCOUNTER — Encounter (HOSPITAL_BASED_OUTPATIENT_CLINIC_OR_DEPARTMENT_OTHER): Payer: Self-pay

## 2020-10-27 ENCOUNTER — Emergency Department (HOSPITAL_BASED_OUTPATIENT_CLINIC_OR_DEPARTMENT_OTHER): Payer: Self-pay

## 2020-10-27 ENCOUNTER — Other Ambulatory Visit: Payer: Self-pay

## 2020-10-27 ENCOUNTER — Inpatient Hospital Stay (HOSPITAL_BASED_OUTPATIENT_CLINIC_OR_DEPARTMENT_OTHER)
Admission: EM | Admit: 2020-10-27 | Discharge: 2020-10-30 | DRG: 392 | Disposition: A | Discharge status: Home / Self Care | Payer: Self-pay | Attending: Internal Medicine | Admitting: Internal Medicine

## 2020-10-27 DIAGNOSIS — R1310 Dysphagia, unspecified: Secondary | ICD-10-CM

## 2020-10-27 DIAGNOSIS — R7989 Other specified abnormal findings of blood chemistry: Secondary | ICD-10-CM

## 2020-10-27 DIAGNOSIS — R000 Tachycardia, unspecified: Secondary | ICD-10-CM

## 2020-10-27 DIAGNOSIS — K200 Eosinophilic esophagitis: Secondary | ICD-10-CM

## 2020-10-27 DIAGNOSIS — Z20822 Contact with and (suspected) exposure to covid-19: Secondary | ICD-10-CM | POA: Diagnosis present

## 2020-10-27 DIAGNOSIS — K222 Esophageal obstruction: Principal | ICD-10-CM | POA: Diagnosis present

## 2020-10-27 DIAGNOSIS — R222 Localized swelling, mass and lump, trunk: Secondary | ICD-10-CM | POA: Diagnosis present

## 2020-10-27 DIAGNOSIS — K2970 Gastritis, unspecified, without bleeding: Secondary | ICD-10-CM

## 2020-10-27 DIAGNOSIS — R1013 Epigastric pain: Secondary | ICD-10-CM | POA: Insufficient documentation

## 2020-10-27 DIAGNOSIS — J9859 Other diseases of mediastinum, not elsewhere classified: Secondary | ICD-10-CM

## 2020-10-27 DIAGNOSIS — R300 Dysuria: Secondary | ICD-10-CM

## 2020-10-27 DIAGNOSIS — Z800 Family history of malignant neoplasm of digestive organs: Secondary | ICD-10-CM

## 2020-10-27 DIAGNOSIS — L509 Urticaria, unspecified: Secondary | ICD-10-CM

## 2020-10-27 DIAGNOSIS — R109 Unspecified abdominal pain: Secondary | ICD-10-CM

## 2020-10-27 HISTORY — DX: Eosinophilic esophagitis: K20.0

## 2020-10-27 LAB — COMPREHENSIVE METABOLIC PANEL
ALT: 37 U/L (ref 0–44)
AST: 24 U/L (ref 15–41)
Albumin: 3.8 g/dL (ref 3.5–5.0)
Alkaline Phosphatase: 67 U/L (ref 38–126)
Anion gap: 10 (ref 5–15)
BUN: 14 mg/dL (ref 6–20)
CO2: 22 mmol/L (ref 22–32)
Calcium: 9.1 mg/dL (ref 8.9–10.3)
Chloride: 103 mmol/L (ref 98–111)
Creatinine, Ser: 1.05 mg/dL (ref 0.61–1.24)
GFR, Estimated: >60 mL/min (ref >60)
Glucose, Bld: 98 mg/dL (ref 70–99)
Potassium: 4.2 mmol/L (ref 3.5–5.1)
Sodium: 135 mmol/L (ref 135–145)
Total Bilirubin: 0.3 mg/dL (ref 0.3–1.2)
Total Protein: 7.6 g/dL (ref 6.5–8.1)

## 2020-10-27 LAB — CBC
HCT: 48.1 % (ref 39.0–52.0)
Hemoglobin: 15.9 g/dL (ref 13.0–17.0)
MCH: 28.9 pg (ref 26.0–34.0)
MCHC: 33.1 g/dL (ref 30.0–36.0)
MCV: 87.5 fL (ref 80.0–100.0)
Platelets: 306 10*3/uL (ref 150–400)
RBC: 5.5 MIL/uL (ref 4.22–5.81)
RDW: 12.8 % (ref 11.5–15.5)
WBC: 9.1 10*3/uL (ref 4.0–10.5)
nRBC: 0 % (ref 0.0–0.2)

## 2020-10-27 LAB — URINALYSIS, ROUTINE W REFLEX MICROSCOPIC
Bilirubin Urine: NEGATIVE
Glucose, UA: NEGATIVE mg/dL
Hgb urine dipstick: NEGATIVE
Ketones, ur: 15 mg/dL — AB
Leukocytes,Ua: NEGATIVE
Nitrite: NEGATIVE
Protein, ur: NEGATIVE mg/dL
Specific Gravity, Urine: >1.03 — ABNORMAL HIGH (ref 1.005–1.030)
pH: 5 (ref 5.0–8.0)

## 2020-10-27 LAB — LIPASE, BLOOD: Lipase: 40 U/L (ref 11–51)

## 2020-10-27 MED ORDER — ALUM & MAG HYDROXIDE-SIMETH 200-200-20 MG/5ML PO SUSP
30.0000 mL | Freq: Once | ORAL | Status: AC
  Administered 2020-10-27: 30 mL via ORAL
  Filled 2020-10-27: qty 30

## 2020-10-27 NOTE — ED Triage Notes (Signed)
Pt arrives with c/o upper abdominal cramps since last week states it has continued to get worse over the last week with nausea, states he did try Prilosec with no change.

## 2020-10-27 NOTE — ED Notes (Signed)
Attempted ultrasound IV insertion X 2 without success. Patient tolerated well. 

## 2020-10-27 NOTE — ED Notes (Signed)
IV attempt x2, unsuccessful. Notified primary nurse

## 2020-10-27 NOTE — ED Provider Notes (Signed)
[facility] EMERGENCY DEPARTMENT Provider Note   CSN: [id number] Arrival date & time: 10/27/20  1713     History Chief Complaint  Patient presents with  . Abdominal Pain    Jeffrey Glover is a 50 y.o. male.  The history is provided by the patient and medical records.   Jeffrey Glover is a 50 y.o. male who presents to the Emergency Department complaining of abdominal pain.  One week of epigastric abdominal pain, worse with breathing. When his symptoms first began he had nausea and indigestion and had this sharp, crampy upper abdominal pain. Initially that sharp pain improved and then it returned. He has associated bloating, poor appetite. His pain is worse with eating.  No fever, chest pain, sob, vomiting, nausea now gone, diarrhea.  Has a hx/o reflux.  Had endoscopy two years ago and was unsuccessful due to stricture.  Takes occasional prilosec.  No prior abdominal surgeries.   He also reports itchy rash that started today.  No tobacco.  Moderate alcohol.  No street drugs.     Past Medical History:  Diagnosis Date  . Dysphagia     Patient Active Problem List   Diagnosis Date Noted  . Esophageal stricture     Past Surgical History:  Procedure Laterality Date  . ESOPHAGOGASTRODUODENOSCOPY (EGD) WITH PROPOFOL N/A 08/30/2017   Procedure: ESOPHAGOGASTRODUODENOSCOPY (EGD) WITH PROPOFOL;  Surgeon: Sherrilyn Rist, MD;  Location: [facility] ENDOSCOPY;  Service: Gastroenterology;  Laterality: N/A;  . UPPER GI ENDOSCOPY  08/03/2017       Family History  Problem Relation Age of Onset  . Pancreatic cancer Maternal Grandmother     Social History   Tobacco Use  . Smoking status: Never Smoker  . Smokeless tobacco: Never Used  Vaping Use  . Vaping Use: Never used  Substance Use Topics  . Alcohol use: Yes    Comment: occ  . Drug use: No    Home Medications Prior to Admission medications   Medication Sig Start Date End Date Taking? Authorizing Provider  ibuprofen  (ADVIL,MOTRIN) 200 MG tablet Take 400 mg by mouth every 6 (six) hours as needed for headache or moderate pain.    [provider]  lansoprazole (PREVACID SOLUTAB) 30 MG disintegrating tablet Take 1 tablet (30 mg total) by mouth 2 (two) times daily before a meal. 10/12/17   Danis, Andreas Blower, MD    Allergies    Other  Review of Systems   Review of Systems  All other systems reviewed and are negative.   Physical Exam Updated Vital Signs BP (!) 141/94   Pulse (!) 111   Temp 98.4 F (36.9 C) (Oral)   Resp (!) 24   Ht 6' (1.829 m)   Wt 104.3 kg   SpO2 93%   BMI 31.19 kg/m   Physical Exam Vitals and nursing note reviewed.  Constitutional:      Appearance: He is well-developed.  HENT:     Head: Normocephalic and atraumatic.  Cardiovascular:     Rate and Rhythm: Regular rhythm. Tachycardia present.     Heart sounds: No murmur heard.   Pulmonary:     Effort: Pulmonary effort is normal. No respiratory distress.     Breath sounds: Normal breath sounds.  Abdominal:     Palpations: Abdomen is soft.     Tenderness: There is no guarding or rebound.     Comments: Mild generalized abdominal tenderness  Musculoskeletal:        General: No swelling  or tenderness.  Skin:    General: Skin is warm and dry.     Comments: Urticaria to trunk, bilateral upper extremities  Neurological:     Mental Status: He is alert and oriented to person, place, and time.  Psychiatric:        Behavior: Behavior normal.     ED Results / Procedures / Treatments   Labs (all labs ordered are listed, but only abnormal results are displayed) Labs Reviewed  URINALYSIS, ROUTINE W REFLEX MICROSCOPIC - Abnormal; Notable for the following components:      Result Value   Specific Gravity, Urine >1.030 (*)    Ketones, ur 15 (*)    All other components within normal limits  D-DIMER, QUANTITATIVE - Abnormal; Notable for the following components:   D-Dimer, Quant 17.93 (*)    All other components  within normal limits  URINE CULTURE  RESP PANEL BY RT-PCR (FLU A&B, COVID) ARPGX2  LIPASE, BLOOD  COMPREHENSIVE METABOLIC PANEL  CBC  BRAIN NATRIURETIC PEPTIDE  TROPONIN I (HIGH SENSITIVITY)    EKG None  Radiology CT Abdomen Pelvis Wo Contrast  Result Date: 10/27/2020 CLINICAL DATA:  Acute abdominal pain and cramping, nausea EXAM: CT ABDOMEN AND PELVIS WITHOUT CONTRAST TECHNIQUE: Multidetector CT imaging of the abdomen and pelvis was performed following the standard protocol without IV contrast. COMPARISON:  None. FINDINGS: Lower chest: Bibasilar atelectasis. The visualized heart and pericardium are unremarkable. Moderate hiatal hernia. Circumferential thickening of the distal esophagus may reflect changes of esophagitis, such as reflux esophagitis. Hepatobiliary: No focal liver abnormality is seen. No gallstones, gallbladder wall thickening, or biliary dilatation. Pancreas: Unremarkable Spleen: Unremarkable Adrenals/Urinary Tract: The adrenal glands are unremarkable. The kidneys are normal. There is extensive perivesicular inflammatory stranding and mild circumferential bladder wall thickening in keeping with changes of infectious or inflammatory cystitis. The bladder is largely decompressed. Stomach/Bowel: Stomach is within normal limits. Appendix appears normal. No evidence of bowel wall thickening, distention, or inflammatory changes. No free intraperitoneal gas or fluid. Vascular/Lymphatic: Several shotty periceliac lymph nodes are identified with a single lymph node demonstrating pathologic enlargement measuring 13 mm in short axis diameter. This is nonspecific, but may relate to the inflammatory changes noted within the region of the gastroesophageal junction. An infiltrative mass with associated nodal involvement, however, could also appear similarly. Shotty retroperitoneal adenopathy is identified within the left periaortic lymph node group without pathologic enlargement. Similarly, several  prominent lymph nodes are identified within the ileocecal lymph node group of the mesentery, again without pathologic enlargement. The abdominal vasculature is unremarkable. Reproductive: Prostate is unremarkable. Other: Tiny fat containing umbilical hernia. The rectum is unremarkable. Musculoskeletal: No acute bone abnormality. No lytic or blastic bone lesions are seen. IMPRESSION: Marked perivesicular inflammatory stranding and mild circumferential bladder wall thickening in keeping with an underlying infectious or inflammatory cystitis. Correlation with urinalysis and urine culture may be helpful. The bladder is not distended. No inflammatory changes identified involving the upper collecting system bilaterally. Small hiatal hernia with marked circumferential thickening involving the distal esophagus, possibly reflecting changes of reflux esophagitis. Note that an infiltrative mass, however, could appear similarly and should be considered given the a particularly prominent periceliac adenopathy. Correlation with barium swallow or endoscopy would be helpful for further evaluation once the patient's acute issues have resolved. Follow-up CT or MRI evaluation may be helpful, additionally, in 3 months to document either stability or resolution of celiac adenopathy if prior examinations are unavailable to confirm stability. Electronically Signed   By: Gloris Ham  Ramiro Harvest MD   On: 10/27/2020 21:25   DG Chest 2 View  Result Date: 10/27/2020 CLINICAL DATA:  Initial evaluation for acute shortness of breath. EXAM: CHEST - 2 VIEW COMPARISON:  Prior radiograph from 08/12/2015. FINDINGS: Cardiac and mediastinal silhouettes are stable, and remain within normal limits. Lungs mildly hypoinflated with elevation of the right hemidiaphragm. Minimal linear bibasilar subsegmental atelectasis. No focal infiltrates. Blunting of the left costophrenic angle suggestive of a trace left pleural effusion. No pulmonary edema. No pneumothorax. No  acute osseous finding. IMPRESSION: 1. Trace left pleural effusion. 2. Elevation of the right hemidiaphragm with associated mild bibasilar subsegmental atelectasis. 3. No other active cardiopulmonary disease. Electronically Signed   By: Rise Mu M.D.   On: 10/27/2020 21:12    Procedures Procedures  Angiocath insertion Performed by: Tilden Fossa  Consent: Verbal consent obtained. Risks and benefits: risks, benefits and alternatives were discussed Time out: Immediately prior to procedure a "time out" was called to verify the correct patient, procedure, equipment, support staff and site/side marked as required.  Preparation: Patient was prepped and draped in the usual sterile fashion.  Vein Location: left AC  Ultrasound Guided  Gauge: 20  Normal blood return and flush without difficulty Patient tolerance: Patient tolerated the procedure well with no immediate complications.    Medications Ordered in ED Medications  alum & mag hydroxide-simeth (MAALOX/MYLANTA) 200-200-20 MG/5ML suspension 30 mL (30 mLs Oral Given 10/27/20 2126)  diphenhydrAMINE (BENADRYL) injection 50 mg (50 mg Intravenous Given 10/28/20 0023)  sodium chloride 0.9 % bolus 1,000 mL (1,000 mLs Intravenous New Bag/Given 10/28/20 0022)  pantoprazole (PROTONIX) injection 40 mg (40 mg Intravenous Given 10/28/20 0024)  iohexol (OMNIPAQUE) 350 MG/ML injection 100 mL (100 mLs Intravenous Contrast Given 10/28/20 0040)    ED Course  I have reviewed the triage vital signs and the nursing notes.  Pertinent labs & imaging results that were available during my care of the patient were reviewed by me and considered in my medical decision making (see chart for details).    MDM Rules/Calculators/A&P                         patient here for evaluation of one week of upper abdominal pain, pleuritic in nature. He is tachycardic on examination but in no acute distress. He does have urticaria, no evidence of anaphylaxis. CT scan  concerning for possible urinary tract infection, UA not consistent with UTI and he has no urinary symptoms, will send culture. CT scan also with changes to his distal esophagus. Given tachycardia and pleuritic nature of pain a D dimer was sent, which is elevated. Will obtain CTA to rule out PE. Will obtain CT abdomen pelvis with contrast to further evaluate abdominal symptoms. Patient care transferred pending imaging.  Final Clinical Impression(s) / ED Diagnoses Final diagnoses:  None    Rx / DC Orders ED Discharge Orders    None       Tilden Fossa, MD 10/28/20 [phone]

## 2020-10-28 ENCOUNTER — Observation Stay (HOSPITAL_BASED_OUTPATIENT_CLINIC_OR_DEPARTMENT_OTHER): Payer: Self-pay

## 2020-10-28 ENCOUNTER — Encounter (HOSPITAL_COMMUNITY): Payer: Self-pay | Admitting: Internal Medicine

## 2020-10-28 ENCOUNTER — Emergency Department (HOSPITAL_BASED_OUTPATIENT_CLINIC_OR_DEPARTMENT_OTHER): Payer: Self-pay

## 2020-10-28 DIAGNOSIS — R300 Dysuria: Secondary | ICD-10-CM

## 2020-10-28 DIAGNOSIS — J9859 Other diseases of mediastinum, not elsewhere classified: Secondary | ICD-10-CM

## 2020-10-28 DIAGNOSIS — L509 Urticaria, unspecified: Secondary | ICD-10-CM

## 2020-10-28 DIAGNOSIS — R7989 Other specified abnormal findings of blood chemistry: Secondary | ICD-10-CM

## 2020-10-28 DIAGNOSIS — R1013 Epigastric pain: Secondary | ICD-10-CM

## 2020-10-28 HISTORY — DX: Other specified abnormal findings of blood chemistry: R79.89

## 2020-10-28 HISTORY — DX: Dysuria: R30.0

## 2020-10-28 HISTORY — DX: Urticaria, unspecified: L50.9

## 2020-10-28 LAB — COMPREHENSIVE METABOLIC PANEL
ALT: 28 U/L (ref 0–44)
AST: 15 U/L (ref 15–41)
Albumin: 3.3 g/dL — ABNORMAL LOW (ref 3.5–5.0)
Alkaline Phosphatase: 62 U/L (ref 38–126)
Anion gap: 8 (ref 5–15)
BUN: 12 mg/dL (ref 6–20)
CO2: 24 mmol/L (ref 22–32)
Calcium: 8.9 mg/dL (ref 8.9–10.3)
Chloride: 107 mmol/L (ref 98–111)
Creatinine, Ser: 1.1 mg/dL (ref 0.61–1.24)
GFR, Estimated: >60 mL/min (ref >60)
Glucose, Bld: 75 mg/dL (ref 70–99)
Potassium: 4.4 mmol/L (ref 3.5–5.1)
Sodium: 139 mmol/L (ref 135–145)
Total Bilirubin: 0.5 mg/dL (ref 0.3–1.2)
Total Protein: 6.6 g/dL (ref 6.5–8.1)

## 2020-10-28 LAB — D-DIMER, QUANTITATIVE: D-Dimer, Quant: 17.93 ug/mL-FEU — ABNORMAL HIGH (ref 0.00–0.50)

## 2020-10-28 LAB — CBC
HCT: 45.3 % (ref 39.0–52.0)
Hemoglobin: 14.6 g/dL (ref 13.0–17.0)
MCH: 29 pg (ref 26.0–34.0)
MCHC: 32.2 g/dL (ref 30.0–36.0)
MCV: 90.1 fL (ref 80.0–100.0)
Platelets: 306 10*3/uL (ref 150–400)
RBC: 5.03 MIL/uL (ref 4.22–5.81)
RDW: 13.2 % (ref 11.5–15.5)
WBC: 8.2 10*3/uL (ref 4.0–10.5)
nRBC: 0 % (ref 0.0–0.2)

## 2020-10-28 LAB — HIV ANTIBODY (ROUTINE TESTING W REFLEX): HIV Screen 4th Generation wRfx: NONREACTIVE

## 2020-10-28 LAB — TROPONIN I (HIGH SENSITIVITY): Troponin I (High Sensitivity): 3 ng/L (ref <18)

## 2020-10-28 LAB — RESP PANEL BY RT-PCR (FLU A&B, COVID) ARPGX2
Influenza A by PCR: NEGATIVE
Influenza B by PCR: NEGATIVE
SARS Coronavirus 2 by RT PCR: NEGATIVE

## 2020-10-28 LAB — BRAIN NATRIURETIC PEPTIDE: B Natriuretic Peptide: 15.7 pg/mL (ref 0.0–100.0)

## 2020-10-28 MED ORDER — PANTOPRAZOLE SODIUM 40 MG IV SOLR
40.0000 mg | Freq: Once | INTRAVENOUS | Status: AC
  Administered 2020-10-28: 40 mg via INTRAVENOUS
  Filled 2020-10-28: qty 40

## 2020-10-28 MED ORDER — ONDANSETRON HCL 4 MG PO TABS: 4.0000 mg | ORAL_TABLET | Freq: Four times a day (QID) | ORAL | Status: DC | PRN

## 2020-10-28 MED ORDER — ACETAMINOPHEN 325 MG PO TABS: 650.0000 mg | ORAL_TABLET | Freq: Four times a day (QID) | ORAL | Status: DC | PRN

## 2020-10-28 MED ORDER — ALBUTEROL SULFATE (2.5 MG/3ML) 0.083% IN NEBU: 2.5000 mg | INHALATION_SOLUTION | Freq: Four times a day (QID) | RESPIRATORY_TRACT | Status: DC | PRN

## 2020-10-28 MED ORDER — DIPHENHYDRAMINE HCL 50 MG/ML IJ SOLN
50.0000 mg | Freq: Once | INTRAMUSCULAR | Status: AC
  Administered 2020-10-28: 50 mg via INTRAVENOUS
  Filled 2020-10-28: qty 1

## 2020-10-28 MED ORDER — ALBUTEROL SULFATE (2.5 MG/3ML) 0.083% IN NEBU
2.5000 mg | INHALATION_SOLUTION | Freq: Four times a day (QID) | RESPIRATORY_TRACT | Status: DC
  Filled 2020-10-28: qty 3

## 2020-10-28 MED ORDER — IOHEXOL 350 MG/ML SOLN
100.0000 mL | Freq: Once | INTRAVENOUS | Status: AC | PRN
  Administered 2020-10-28: 100 mL via INTRAVENOUS

## 2020-10-28 MED ORDER — DIPHENHYDRAMINE HCL 50 MG/ML IJ SOLN
25.0000 mg | Freq: Four times a day (QID) | INTRAMUSCULAR | Status: DC | PRN
  Administered 2020-10-28: 25 mg via INTRAVENOUS
  Filled 2020-10-28: qty 1

## 2020-10-28 MED ORDER — LACTATED RINGERS IV SOLN: INTRAVENOUS | Status: AC

## 2020-10-28 MED ORDER — MORPHINE SULFATE (PF) 2 MG/ML IV SOLN: 2.0000 mg | INTRAVENOUS | Status: DC | PRN

## 2020-10-28 MED ORDER — DIPHENHYDRAMINE HCL 50 MG/ML IJ SOLN
12.5000 mg | Freq: Once | INTRAMUSCULAR | Status: AC
  Administered 2020-10-28: 12.5 mg via INTRAVENOUS
  Filled 2020-10-28: qty 1

## 2020-10-28 MED ORDER — LACTATED RINGERS IV BOLUS
1000.0000 mL | Freq: Once | INTRAVENOUS | Status: AC
  Administered 2020-10-28: 1000 mL via INTRAVENOUS

## 2020-10-28 MED ORDER — ACETAMINOPHEN 650 MG RE SUPP: 650.0000 mg | Freq: Four times a day (QID) | RECTAL | Status: DC | PRN

## 2020-10-28 MED ORDER — ENOXAPARIN SODIUM 40 MG/0.4ML IJ SOSY: 40.0000 mg | PREFILLED_SYRINGE | INTRAMUSCULAR | Status: DC

## 2020-10-28 MED ORDER — SODIUM CHLORIDE 0.9 % IV BOLUS
1000.0000 mL | Freq: Once | INTRAVENOUS | Status: AC
  Administered 2020-10-28: 1000 mL via INTRAVENOUS

## 2020-10-28 MED ORDER — HYDROCODONE-ACETAMINOPHEN 5-325 MG PO TABS
1.0000 | ORAL_TABLET | ORAL | Status: DC | PRN
  Filled 2020-10-28: qty 1

## 2020-10-28 MED ORDER — POLYETHYLENE GLYCOL 3350 17 G PO PACK: 17.0000 g | PACK | Freq: Every day | ORAL | Status: DC | PRN

## 2020-10-28 MED ORDER — PANTOPRAZOLE SODIUM 40 MG PO TBEC
40.0000 mg | DELAYED_RELEASE_TABLET | Freq: Every day | ORAL | Status: DC
  Administered 2020-10-28 – 2020-10-29 (×2): 40 mg via ORAL
  Filled 2020-10-28 (×2): qty 1

## 2020-10-28 MED ORDER — ONDANSETRON HCL 4 MG/2ML IJ SOLN: 4.0000 mg | Freq: Four times a day (QID) | INTRAMUSCULAR | Status: DC | PRN

## 2020-10-28 NOTE — ED Provider Notes (Signed)
12:02 AM Assumed care from Dr. Madilyn Hook, please see their note for full history, physical and decision making until this point. In brief this is a 50 y.o. year old male who presented to the ED tonight with Abdominal Pain     GI symptoms and also pleuritic upper abdominal pain. Pending CT scans.   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 As this is worsening po intake and likely dehydration will admit for repeat GI eval.   Labs, studies and imaging reviewed by myself and considered in medical decision making if ordered. Imaging interpreted by radiology.  Labs Reviewed  URINALYSIS, ROUTINE W REFLEX MICROSCOPIC - Abnormal; Notable for the following components:      Result Value   Specific Gravity, Urine >1.030 (*)    Ketones, ur 15 (*)    All other components within normal limits  D-DIMER, QUANTITATIVE - Abnormal; Notable for the following components:   D-Dimer, Quant 17.93 (*)    All other components within normal limits  URINE CULTURE  LIPASE, BLOOD  COMPREHENSIVE METABOLIC PANEL  CBC  TROPONIN I (HIGH SENSITIVITY)    CT Abdomen Pelvis Wo Contrast  Final Result    DG Chest 2 View  Final Result      No follow-ups on file.    Macrina Lehnert, Barbara Cower, MD 10/28/20 [phone]

## 2020-10-28 NOTE — ED Notes (Signed)
Report given to [facility] 

## 2020-10-28 NOTE — Progress Notes (Signed)
Bilateral lower extremity venous duplex has been completed. Preliminary results can be found in CV Proc through chart review.   10/28/20 3:00 PM Olen Cordial RVT

## 2020-10-28 NOTE — ED Notes (Signed)
Pt asleep at this time, snoring noted

## 2020-10-28 NOTE — Consult Note (Addendum)
[facility]  [phone] [phone]   MEDICAL ONCOLOGY - INITIAL CONSULTATION  Referral MD: Dr. Whitney Post  Reason for Referral: Anterior mediastinal mass  HPI: Jeffrey Glover is a 50 year old male with a past medical history significant for GERD, eosinophilic esophagitis, severe esophageal stenosis.  He presented to the emergency room with a 1 week history of epigastric pain that worsened with breathing.  He initially had nausea and indigestion with sharp and crampy upper abdominal pain.  He reports a poor appetite, abdominal bloating, and pain that worsens with eating.  Initial labs showed a normal CBC and CMET.  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  He had marked circumferential wall thickening and extensive surrounding inflammatory stranding involving the distal esophagus possibly related to change of esophagitis but infiltrative neoplasm could appear similarly.  He has shotty periceliac adenopathy which is borderline pathologic and may be reactive but nodal infiltration could appear similarly.  The patient was seen in his hospital room.  No family at the bedside.  The patient reports that he has had a poor appetite and is unsure if he has lost weight recently.  Reports shortness of breath with exertion.  He denies any recent fevers, chills, night sweats.  He has not noticed any adenopathy.  He also denies testicular masses.  He denies headaches, dizziness, chest pain, vomiting, constipation, diarrhea, bleeding.  The patient is separated.  He has 2 daughters.  Reports occasional alcohol use denies tobacco use.  Medical oncology was asked to see the patient for recommendations regarding his anterior mediastinal mass.   Past Medical History:  Diagnosis Date   Dysphagia    :  Past Surgical History:   Procedure Laterality Date   ESOPHAGOGASTRODUODENOSCOPY (EGD) WITH PROPOFOL N/A 08/30/2017   Procedure: ESOPHAGOGASTRODUODENOSCOPY (EGD) WITH PROPOFOL;  Surgeon: Sherrilyn Rist, MD;  Location: [facility];  Service: Gastroenterology;  Laterality: N/A;   UPPER GI ENDOSCOPY  08/03/2017   :  Current Facility-Administered Medications  Medication Dose Route Frequency Provider Last Rate Last Admin   acetaminophen (TYLENOL) tablet 650 mg  650 mg Oral Q6H PRN Jae Dire, MD       Or   acetaminophen (TYLENOL) suppository 650 mg  650 mg Rectal Q6H PRN Jae Dire, MD       albuterol (PROVENTIL) (2.5 MG/3ML) 0.083% nebulizer solution 2.5 mg  2.5 mg Nebulization Q6H Jae Dire, MD       diphenhydrAMINE (BENADRYL) injection 12.5 mg  12.5 mg Intravenous Once Jae Dire, MD       enoxaparin (LOVENOX) injection 40 mg  40 mg Subcutaneous Q24H Jae Dire, MD       HYDROcodone-acetaminophen (NORCO/VICODIN) 5-325 MG per tablet 1 tablet  1 tablet Oral Q4H PRN Jae Dire, MD       lactated ringers infusion   Intravenous Continuous Jae Dire, MD       morphine 2 MG/ML injection 2 mg  2 mg Intravenous Q2H PRN Jae Dire, MD       ondansetron [facility]) tablet 4 mg  4 mg Oral Q6H PRN Jae Dire, MD       Or   ondansetron [facility]) injection 4 mg  4 mg Intravenous Q6H PRN Jae Dire, MD       pantoprazole (PROTONIX)  EC tablet 40 mg  40 mg Oral Daily Jae Dire, MD       polyethylene glycol (MIRALAX / GLYCOLAX) packet 17 g  17 g Oral Daily PRN Jae Dire, MD         Allergies  Allergen Reactions   Other Anaphylaxis and Other (See Comments)    Fresh fish, can't breath   :  Family History  Problem Relation Age of Onset   Pancreatic cancer Maternal Grandmother    :  Social History   Socioeconomic History   Marital status: Married    Spouse name: Not on file   Number of children: 2   Years of education: Not on file   Highest education level: Not on file   Occupational History   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  Tobacco Use   Smoking status: Never Smoker   Smokeless tobacco: Never Used  Building services engineer Use: Never used  Substance and Sexual Activity   Alcohol use: Yes    Comment: occ   Drug use: No   Sexual activity: Not on file  Other Topics Concern   Not on file  Social History Narrative   Not on file   Social Determinants of Health   Financial Resource Strain: Not on file  Food Insecurity: Not on file  Transportation Needs: Not on file  Physical Activity: Not on file  Stress: Not on file  Social Connections: Not on file  Intimate Partner Violence: Not on file  :  Review of Systems: A comprehensive 14 point review of systems was negative except as noted in the HPI.  Exam: Patient Vitals for the past 24 hrs:  BP Temp Temp src Pulse Resp SpO2 Height Weight  10/28/20 1258 137/85 (!) 97.5 F (36.4 C) Oral 96 16 98 % -- --  10/28/20 1200 (!) 141/77 -- -- 93 20 96 % -- --  10/28/20 0900 133/74 -- -- 94 20 97 % -- --  10/28/20 0830 -- -- -- (!) 113 (!) 22 98 % -- --  10/28/20 0800 139/85 -- -- 93 (!) 34 95 % -- --  10/28/20 0700 138/81 -- -- 96 (!) 23 98 % -- --  10/28/20 0600 131/79 -- -- 95 (!) 22 97 % -- --  10/28/20 0500 128/80 -- -- 95 20 94 % -- --  10/28/20 0400 (!) 141/80 -- -- (!) 105 17 92 % -- --  10/28/20 0300 134/88 -- -- (!) 107 (!) 27 91 % -- --  10/28/20 0200 134/84 -- -- (!) 103 (!) 24 94 % -- --  10/28/20 0139 134/84 -- -- (!) 107 20 94 % -- --  10/28/20 0030 (!) 141/94 -- -- (!) 111 (!) 24 93 % -- --  10/27/20 2330 (!) 167/96 -- -- (!) 117 (!) 26 93 % -- --  10/27/20 2125 -- -- -- (!) 111 (!) 22 94 % -- --  10/27/20 2123 (!) 160/92 -- -- (!) 111 (!) 22 94 % -- --  10/27/20 1727 -- -- -- -- -- -- 6' (1.829 m) 104.3 kg  10/27/20 1725 (!) 153/103 98.4 F (36.9 C) Oral (!) 112 18 97 % -- --    General:  well-nourished in no acute distress.   Eyes:  no scleral icterus.   ENT:  There were no  oropharyngeal lesions.   Lymphatics: No palpable cervical, supraclavicular, axillary, or inguinal lymphadenopathy. Respiratory: lungs were clear bilaterally without wheezing or crackles.   Cardiovascular: Tachycardic, no  lower extremity edema. GI: Positive bowel sounds, soft, mild tenderness with palpation. Musculoskeletal: Strength symmetrical in the upper and lower extremities. Skin exam was without echymosis, petichae.   Neuro exam was nonfocal. Patient was alert and oriented.  Attention was good.   Language was appropriate.  Mood was normal without depression.  Speech was not pressured.  Thought content was not tangential.     Lab Results  Component Value Date   WBC 9.1 10/27/2020   HGB 15.9 10/27/2020   HCT 48.1 10/27/2020   PLT 306 10/27/2020   GLUCOSE 98 10/27/2020   ALT 37 10/27/2020   AST 24 10/27/2020   NA 135 10/27/2020   K 4.2 10/27/2020   CL 103 10/27/2020   CREATININE 1.05 10/27/2020   BUN 14 10/27/2020   CO2 22 10/27/2020   I have personally reviewed the CT imaging  CT Abdomen Pelvis Wo Contrast  Result Date: 10/27/2020 CLINICAL DATA:  Acute abdominal pain and cramping, nausea EXAM: CT ABDOMEN AND PELVIS WITHOUT CONTRAST TECHNIQUE: Multidetector CT imaging of the abdomen and pelvis was performed following the standard protocol without IV contrast. COMPARISON:  None. FINDINGS: Lower chest: Bibasilar atelectasis. The visualized heart and pericardium are unremarkable. Moderate hiatal hernia. Circumferential thickening of the distal esophagus may reflect changes of esophagitis, such as reflux esophagitis. Hepatobiliary: No focal liver abnormality is seen. No gallstones, gallbladder wall thickening, or biliary dilatation. Pancreas: Unremarkable Spleen: Unremarkable Adrenals/Urinary Tract: The adrenal glands are unremarkable. The kidneys are normal. There is extensive perivesicular inflammatory stranding and mild circumferential bladder wall thickening in keeping with changes of  infectious or inflammatory cystitis. The bladder is largely decompressed. Stomach/Bowel: Stomach is within normal limits. Appendix appears normal. No evidence of bowel wall thickening, distention, or inflammatory changes. No free intraperitoneal gas or fluid. Vascular/Lymphatic: Several shotty periceliac lymph nodes are identified with a single lymph node demonstrating pathologic enlargement measuring 13 mm in short axis diameter. This is nonspecific, but may relate to the inflammatory changes noted within the region of the gastroesophageal junction. An infiltrative mass with associated nodal involvement, however, could also appear similarly. Shotty retroperitoneal adenopathy is identified within the left periaortic lymph node group without pathologic enlargement. Similarly, several prominent lymph nodes are identified within the ileocecal lymph node group of the mesentery, again without pathologic enlargement. The abdominal vasculature is unremarkable. Reproductive: Prostate is unremarkable. Other: Tiny fat containing umbilical hernia. The rectum is unremarkable. Musculoskeletal: No acute bone abnormality. No lytic or blastic bone lesions are seen. IMPRESSION: Marked perivesicular inflammatory stranding and mild circumferential bladder wall thickening in keeping with an underlying infectious or inflammatory cystitis. Correlation with urinalysis and urine culture may be helpful. The bladder is not distended. No inflammatory changes identified involving the upper collecting system bilaterally. Small hiatal hernia with marked circumferential thickening involving the distal esophagus, possibly reflecting changes of reflux esophagitis. Note that an infiltrative mass, however, could appear similarly and should be considered given the a particularly prominent periceliac adenopathy. Correlation with barium swallow or endoscopy would be helpful for further evaluation once the patient's acute issues have resolved. Follow-up  CT or MRI evaluation may be helpful, additionally, in 3 months to document either stability or resolution of celiac adenopathy if prior examinations are unavailable to confirm stability. Electronically Signed   By: Helyn Numbers MD   On: 10/27/2020 21:25   DG Chest 2 View  Result Date: 10/27/2020 CLINICAL DATA:  Initial evaluation for acute shortness of breath. EXAM: CHEST - 2 VIEW COMPARISON:  Prior radiograph  from 08/12/2015. FINDINGS: Cardiac and mediastinal silhouettes are stable, and remain within normal limits. Lungs mildly hypoinflated with elevation of the right hemidiaphragm. Minimal linear bibasilar subsegmental atelectasis. No focal infiltrates. Blunting of the left costophrenic angle suggestive of a trace left pleural effusion. No pulmonary edema. No pneumothorax. No acute osseous finding. IMPRESSION: 1. Trace left pleural effusion. 2. Elevation of the right hemidiaphragm with associated mild bibasilar subsegmental atelectasis. 3. No other active cardiopulmonary disease. Electronically Signed   By: Rise Mu M.D.   On: 10/27/2020 21:12   CT Angio Chest PE W/Cm &/Or Wo Cm  Result Date: 10/28/2020 CLINICAL DATA:  High probability pulmonary embolism. Upper abdominal cramping EXAM: CT ANGIOGRAPHY CHEST CT ABDOMEN AND PELVIS WITH CONTRAST TECHNIQUE: Multidetector CT imaging of the chest was performed using the standard protocol during bolus administration of intravenous contrast. Multiplanar CT image reconstructions and MIPs were obtained to evaluate the vascular anatomy. Multidetector CT imaging of the abdomen and pelvis was performed using the standard protocol during bolus administration of intravenous contrast. CONTRAST:  OMNIPAQUE IOHEXOL 350 MG/ML SOLN COMPARISON:  9:07 p.m. FINDINGS: CTA CHEST FINDINGS Cardiovascular: There is adequate opacification of the a pulmonary arterial tree. No intraluminal filling defect identified to suggest acute pulmonary embolism. The central  pulmonary arteries are of normal caliber. Cardiac size within normal limits. No pericardial effusion. No significant coronary artery calcification. The thoracic aorta is unremarkable. Mediastinum/Nodes: Small hiatal hernia. Marked circumferential thickening of the distal esophagus is present, possibly reflecting changes of esophagitis, such as reflux esophagitis. Note that infiltrative disease could appear similarly. No evidence of obstruction or perforation. A a 2.3 x 2.5 x 3.1 cm soft tissue mass is seen within the anterior mediastinum which is separate from the adjacent left thyroid lobe. This may represent an anterior mediastinal mass such as a thymoma or lymphoma. No pathologic thoracic adenopathy. The thyroid is unremarkable. Lungs/Pleura: Bibasilar atelectasis. No superimposed focal pulmonary infiltrate. No pneumothorax or pleural effusion. The central airways are widely patent. Musculoskeletal: No chest wall abnormality. No acute or significant osseous findings. Review of the MIP images confirms the above findings. CT ABDOMEN and PELVIS FINDINGS Hepatobiliary: No focal liver abnormality is seen. No gallstones, gallbladder wall thickening, or biliary dilatation. Pancreas: Unremarkable Spleen: Unremarkable Adrenals/Urinary Tract: The adrenal glands are unremarkable. The kidneys are normal. There is extensive perivesicular inflammatory stranding again identified. Mild circumferential bladder wall thickening is again noted. Together, the findings are in keeping with infectious or inflammatory cystitis. Stomach/Bowel: Stomach is within normal limits. Appendix appears normal. No evidence of bowel wall thickening, distention, or inflammatory changes. No free intraperitoneal gas or fluid. Vascular/Lymphatic: As noted previously, there is shotty periceliac adenopathy with a single pathologically enlarged lymph node measuring 12-13 mm in short axis diameter. This may be reactive given the extensive inflammatory  changes involving the gastroesophageal junction, though an infiltrative neoplasm and associated nodal adenopathy could appear similarly. Shotty adenopathy is also noted within the left periaortic lymph node group as well as the ileocecal lymph node group of the mesentery, nonspecific. No additional pathologic adenopathy within the abdomen and pelvis. The abdominal vasculature is unremarkable. Reproductive: Prostate is unremarkable. Other: Tiny fat containing umbilical hernia. The rectum is unremarkable. Musculoskeletal: No acute bone abnormality. No lytic or blastic bone lesion. Review of the MIP images confirms the above findings. IMPRESSION: No pulmonary embolism. 3.1 cm soft tissue mass within the anterior mediastinum. Differential considerations are led by thymoma or lymphoma in a patient of this age. Comparison with prior examinations  would be helpful in determining chronicity. If none are available, PET CT examination may be helpful for further management. Marked circumferential wall thickening and extensive surrounding inflammatory stranding involving the distal esophagus possibly related to changes of esophagitis. Infiltrative neoplasm, however, could appear similarly. SA seated shotty periceliac adenopathy with borderline pathologic enlargement may simply be reactive in nature, however, nodal infiltration could appear similarly. Correlation with endoscopy or barium swallow is recommended. Follow-up CT examination in 3 months may also be helpful to document stability or resolution of adenopathy. Extensive perivesicular inflammatory stranding and circumferential bladder wall thickening again noted in keeping with changes of infectious or inflammatory cystitis. No inflammatory changes seen involving the upper collecting system bilaterally. Correlation with urinalysis and urine culture is recommended. Electronically Signed   By: Helyn Numbers MD   On: 10/28/2020 02:17   CT Abdomen Pelvis W Contrast  Result  Date: 10/28/2020 CLINICAL DATA:  High probability pulmonary embolism. Upper abdominal cramping EXAM: CT ANGIOGRAPHY CHEST CT ABDOMEN AND PELVIS WITH CONTRAST TECHNIQUE: Multidetector CT imaging of the chest was performed using the standard protocol during bolus administration of intravenous contrast. Multiplanar CT image reconstructions and MIPs were obtained to evaluate the vascular anatomy. Multidetector CT imaging of the abdomen and pelvis was performed using the standard protocol during bolus administration of intravenous contrast. CONTRAST:  OMNIPAQUE IOHEXOL 350 MG/ML SOLN COMPARISON:  9:07 p.m. FINDINGS: CTA CHEST FINDINGS Cardiovascular: There is adequate opacification of the a pulmonary arterial tree. No intraluminal filling defect identified to suggest acute pulmonary embolism. The central pulmonary arteries are of normal caliber. Cardiac size within normal limits. No pericardial effusion. No significant coronary artery calcification. The thoracic aorta is unremarkable. Mediastinum/Nodes: Small hiatal hernia. Marked circumferential thickening of the distal esophagus is present, possibly reflecting changes of esophagitis, such as reflux esophagitis. Note that infiltrative disease could appear similarly. No evidence of obstruction or perforation. A a 2.3 x 2.5 x 3.1 cm soft tissue mass is seen within the anterior mediastinum which is separate from the adjacent left thyroid lobe. This may represent an anterior mediastinal mass such as a thymoma or lymphoma. No pathologic thoracic adenopathy. The thyroid is unremarkable. Lungs/Pleura: Bibasilar atelectasis. No superimposed focal pulmonary infiltrate. No pneumothorax or pleural effusion. The central airways are widely patent. Musculoskeletal: No chest wall abnormality. No acute or significant osseous findings. Review of the MIP images confirms the above findings. CT ABDOMEN and PELVIS FINDINGS Hepatobiliary: No focal liver abnormality is seen. No  gallstones, gallbladder wall thickening, or biliary dilatation. Pancreas: Unremarkable Spleen: Unremarkable Adrenals/Urinary Tract: The adrenal glands are unremarkable. The kidneys are normal. There is extensive perivesicular inflammatory stranding again identified. Mild circumferential bladder wall thickening is again noted. Together, the findings are in keeping with infectious or inflammatory cystitis. Stomach/Bowel: Stomach is within normal limits. Appendix appears normal. No evidence of bowel wall thickening, distention, or inflammatory changes. No free intraperitoneal gas or fluid. Vascular/Lymphatic: As noted previously, there is shotty periceliac adenopathy with a single pathologically enlarged lymph node measuring 12-13 mm in short axis diameter. This may be reactive given the extensive inflammatory changes involving the gastroesophageal junction, though an infiltrative neoplasm and associated nodal adenopathy could appear similarly. Shotty adenopathy is also noted within the left periaortic lymph node group as well as the ileocecal lymph node group of the mesentery, nonspecific. No additional pathologic adenopathy within the abdomen and pelvis. The abdominal vasculature is unremarkable. Reproductive: Prostate is unremarkable. Other: Tiny fat containing umbilical hernia. The rectum is unremarkable. Musculoskeletal: No acute  bone abnormality. No lytic or blastic bone lesion. Review of the MIP images confirms the above findings. IMPRESSION: No pulmonary embolism. 3.1 cm soft tissue mass within the anterior mediastinum. Differential considerations are led by thymoma or lymphoma in a patient of this age. Comparison with prior examinations would be helpful in determining chronicity. If none are available, PET CT examination may be helpful for further management. Marked circumferential wall thickening and extensive surrounding inflammatory stranding involving the distal esophagus possibly related to changes of  esophagitis. Infiltrative neoplasm, however, could appear similarly. SA seated shotty periceliac adenopathy with borderline pathologic enlargement may simply be reactive in nature, however, nodal infiltration could appear similarly. Correlation with endoscopy or barium swallow is recommended. Follow-up CT examination in 3 months may also be helpful to document stability or resolution of adenopathy. Extensive perivesicular inflammatory stranding and circumferential bladder wall thickening again noted in keeping with changes of infectious or inflammatory cystitis. No inflammatory changes seen involving the upper collecting system bilaterally. Correlation with urinalysis and urine culture is recommended. Electronically Signed   By: Helyn Numbers MD   On: 10/28/2020 02:17     CT Abdomen Pelvis Wo Contrast  Result Date: 10/27/2020 CLINICAL DATA:  Acute abdominal pain and cramping, nausea EXAM: CT ABDOMEN AND PELVIS WITHOUT CONTRAST TECHNIQUE: Multidetector CT imaging of the abdomen and pelvis was performed following the standard protocol without IV contrast. COMPARISON:  None. FINDINGS: Lower chest: Bibasilar atelectasis. The visualized heart and pericardium are unremarkable. Moderate hiatal hernia. Circumferential thickening of the distal esophagus may reflect changes of esophagitis, such as reflux esophagitis. Hepatobiliary: No focal liver abnormality is seen. No gallstones, gallbladder wall thickening, or biliary dilatation. Pancreas: Unremarkable Spleen: Unremarkable Adrenals/Urinary Tract: The adrenal glands are unremarkable. The kidneys are normal. There is extensive perivesicular inflammatory stranding and mild circumferential bladder wall thickening in keeping with changes of infectious or inflammatory cystitis. The bladder is largely decompressed. Stomach/Bowel: Stomach is within normal limits. Appendix appears normal. No evidence of bowel wall thickening, distention, or inflammatory changes. No free  intraperitoneal gas or fluid. Vascular/Lymphatic: Several shotty periceliac lymph nodes are identified with a single lymph node demonstrating pathologic enlargement measuring 13 mm in short axis diameter. This is nonspecific, but may relate to the inflammatory changes noted within the region of the gastroesophageal junction. An infiltrative mass with associated nodal involvement, however, could also appear similarly. Shotty retroperitoneal adenopathy is identified within the left periaortic lymph node group without pathologic enlargement. Similarly, several prominent lymph nodes are identified within the ileocecal lymph node group of the mesentery, again without pathologic enlargement. The abdominal vasculature is unremarkable. Reproductive: Prostate is unremarkable. Other: Tiny fat containing umbilical hernia. The rectum is unremarkable. Musculoskeletal: No acute bone abnormality. No lytic or blastic bone lesions are seen. IMPRESSION: Marked perivesicular inflammatory stranding and mild circumferential bladder wall thickening in keeping with an underlying infectious or inflammatory cystitis. Correlation with urinalysis and urine culture may be helpful. The bladder is not distended. No inflammatory changes identified involving the upper collecting system bilaterally. Small hiatal hernia with marked circumferential thickening involving the distal esophagus, possibly reflecting changes of reflux esophagitis. Note that an infiltrative mass, however, could appear similarly and should be considered given the a particularly prominent periceliac adenopathy. Correlation with barium swallow or endoscopy would be helpful for further evaluation once the patient's acute issues have resolved. Follow-up CT or MRI evaluation may be helpful, additionally, in 3 months to document either stability or resolution of celiac adenopathy if prior examinations are unavailable to  confirm stability. Electronically Signed   By: Helyn NumbersAshesh  Parikh  MD   On: 10/27/2020 21:25   DG Chest 2 View  Result Date: 10/27/2020 CLINICAL DATA:  Initial evaluation for acute shortness of breath. EXAM: CHEST - 2 VIEW COMPARISON:  Prior radiograph from 08/12/2015. FINDINGS: Cardiac and mediastinal silhouettes are stable, and remain within normal limits. Lungs mildly hypoinflated with elevation of the right hemidiaphragm. Minimal linear bibasilar subsegmental atelectasis. No focal infiltrates. Blunting of the left costophrenic angle suggestive of a trace left pleural effusion. No pulmonary edema. No pneumothorax. No acute osseous finding. IMPRESSION: 1. Trace left pleural effusion. 2. Elevation of the right hemidiaphragm with associated mild bibasilar subsegmental atelectasis. 3. No other active cardiopulmonary disease. Electronically Signed   By: Rise MuBenjamin  McClintock M.D.   On: 10/27/2020 21:12   CT Angio Chest PE W/Cm &/Or Wo Cm  Result Date: 10/28/2020 CLINICAL DATA:  High probability pulmonary embolism. Upper abdominal cramping EXAM: CT ANGIOGRAPHY CHEST CT ABDOMEN AND PELVIS WITH CONTRAST TECHNIQUE: Multidetector CT imaging of the chest was performed using the standard protocol during bolus administration of intravenous contrast. Multiplanar CT image reconstructions and MIPs were obtained to evaluate the vascular anatomy. Multidetector CT imaging of the abdomen and pelvis was performed using the standard protocol during bolus administration of intravenous contrast. CONTRAST:  100mL OMNIPAQUE IOHEXOL 350 MG/ML SOLN COMPARISON:  9:07 p.m. FINDINGS: CTA CHEST FINDINGS Cardiovascular: There is adequate opacification of the a pulmonary arterial tree. No intraluminal filling defect identified to suggest acute pulmonary embolism. The central pulmonary arteries are of normal caliber. Cardiac size within normal limits. No pericardial effusion. No significant coronary artery calcification. The thoracic aorta is unremarkable. Mediastinum/Nodes: Small hiatal hernia. Marked  circumferential thickening of the distal esophagus is present, possibly reflecting changes of esophagitis, such as reflux esophagitis. Note that infiltrative disease could appear similarly. No evidence of obstruction or perforation. A a 2.3 x 2.5 x 3.1 cm soft tissue mass is seen within the anterior mediastinum which is separate from the adjacent left thyroid lobe. This may represent an anterior mediastinal mass such as a thymoma or lymphoma. No pathologic thoracic adenopathy. The thyroid is unremarkable. Lungs/Pleura: Bibasilar atelectasis. No superimposed focal pulmonary infiltrate. No pneumothorax or pleural effusion. The central airways are widely patent. Musculoskeletal: No chest wall abnormality. No acute or significant osseous findings. Review of the MIP images confirms the above findings. CT ABDOMEN and PELVIS FINDINGS Hepatobiliary: No focal liver abnormality is seen. No gallstones, gallbladder wall thickening, or biliary dilatation. Pancreas: Unremarkable Spleen: Unremarkable Adrenals/Urinary Tract: The adrenal glands are unremarkable. The kidneys are normal. There is extensive perivesicular inflammatory stranding again identified. Mild circumferential bladder wall thickening is again noted. Together, the findings are in keeping with infectious or inflammatory cystitis. Stomach/Bowel: Stomach is within normal limits. Appendix appears normal. No evidence of bowel wall thickening, distention, or inflammatory changes. No free intraperitoneal gas or fluid. Vascular/Lymphatic: As noted previously, there is shotty periceliac adenopathy with a single pathologically enlarged lymph node measuring 12-13 mm in short axis diameter. This may be reactive given the extensive inflammatory changes involving the gastroesophageal junction, though an infiltrative neoplasm and associated nodal adenopathy could appear similarly. Shotty adenopathy is also noted within the left periaortic lymph node group as well as the ileocecal  lymph node group of the mesentery, nonspecific. No additional pathologic adenopathy within the abdomen and pelvis. The abdominal vasculature is unremarkable. Reproductive: Prostate is unremarkable. Other: Tiny fat containing umbilical hernia. The rectum is unremarkable. Musculoskeletal: No acute bone abnormality.  No lytic or blastic bone lesion. Review of the MIP images confirms the above findings. IMPRESSION: No pulmonary embolism. 3.1 cm soft tissue mass within the anterior mediastinum. Differential considerations are led by thymoma or lymphoma in a patient of this age. Comparison with prior examinations would be helpful in determining chronicity. If none are available, PET CT examination may be helpful for further management. Marked circumferential wall thickening and extensive surrounding inflammatory stranding involving the distal esophagus possibly related to changes of esophagitis. Infiltrative neoplasm, however, could appear similarly. SA seated shotty periceliac adenopathy with borderline pathologic enlargement may simply be reactive in nature, however, nodal infiltration could appear similarly. Correlation with endoscopy or barium swallow is recommended. Follow-up CT examination in 3 months may also be helpful to document stability or resolution of adenopathy. Extensive perivesicular inflammatory stranding and circumferential bladder wall thickening again noted in keeping with changes of infectious or inflammatory cystitis. No inflammatory changes seen involving the upper collecting system bilaterally. Correlation with urinalysis and urine culture is recommended. Electronically Signed   By: Helyn Numbers MD   On: 10/28/2020 02:17   CT Abdomen Pelvis W Contrast  Result Date: 10/28/2020 CLINICAL DATA:  High probability pulmonary embolism. Upper abdominal cramping EXAM: CT ANGIOGRAPHY CHEST CT ABDOMEN AND PELVIS WITH CONTRAST TECHNIQUE: Multidetector CT imaging of the chest was performed using the  standard protocol during bolus administration of intravenous contrast. Multiplanar CT image reconstructions and MIPs were obtained to evaluate the vascular anatomy. Multidetector CT imaging of the abdomen and pelvis was performed using the standard protocol during bolus administration of intravenous contrast. CONTRAST:  OMNIPAQUE IOHEXOL 350 MG/ML SOLN COMPARISON:  9:07 p.m. FINDINGS: CTA CHEST FINDINGS Cardiovascular: There is adequate opacification of the a pulmonary arterial tree. No intraluminal filling defect identified to suggest acute pulmonary embolism. The central pulmonary arteries are of normal caliber. Cardiac size within normal limits. No pericardial effusion. No significant coronary artery calcification. The thoracic aorta is unremarkable. Mediastinum/Nodes: Small hiatal hernia. Marked circumferential thickening of the distal esophagus is present, possibly reflecting changes of esophagitis, such as reflux esophagitis. Note that infiltrative disease could appear similarly. No evidence of obstruction or perforation. A a 2.3 x 2.5 x 3.1 cm soft tissue mass is seen within the anterior mediastinum which is separate from the adjacent left thyroid lobe. This may represent an anterior mediastinal mass such as a thymoma or lymphoma. No pathologic thoracic adenopathy. The thyroid is unremarkable. Lungs/Pleura: Bibasilar atelectasis. No superimposed focal pulmonary infiltrate. No pneumothorax or pleural effusion. The central airways are widely patent. Musculoskeletal: No chest wall abnormality. No acute or significant osseous findings. Review of the MIP images confirms the above findings. CT ABDOMEN and PELVIS FINDINGS Hepatobiliary: No focal liver abnormality is seen. No gallstones, gallbladder wall thickening, or biliary dilatation. Pancreas: Unremarkable Spleen: Unremarkable Adrenals/Urinary Tract: The adrenal glands are unremarkable. The kidneys are normal. There is extensive perivesicular inflammatory  stranding again identified. Mild circumferential bladder wall thickening is again noted. Together, the findings are in keeping with infectious or inflammatory cystitis. Stomach/Bowel: Stomach is within normal limits. Appendix appears normal. No evidence of bowel wall thickening, distention, or inflammatory changes. No free intraperitoneal gas or fluid. Vascular/Lymphatic: As noted previously, there is shotty periceliac adenopathy with a single pathologically enlarged lymph node measuring 12-13 mm in short axis diameter. This may be reactive given the extensive inflammatory changes involving the gastroesophageal junction, though an infiltrative neoplasm and associated nodal adenopathy could appear similarly. Shotty adenopathy is also noted within the left periaortic  lymph node group as well as the ileocecal lymph node group of the mesentery, nonspecific. No additional pathologic adenopathy within the abdomen and pelvis. The abdominal vasculature is unremarkable. Reproductive: Prostate is unremarkable. Other: Tiny fat containing umbilical hernia. The rectum is unremarkable. Musculoskeletal: No acute bone abnormality. No lytic or blastic bone lesion. Review of the MIP images confirms the above findings. IMPRESSION: No pulmonary embolism. 3.1 cm soft tissue mass within the anterior mediastinum. Differential considerations are led by thymoma or lymphoma in a patient of this age. Comparison with prior examinations would be helpful in determining chronicity. If none are available, PET CT examination may be helpful for further management. Marked circumferential wall thickening and extensive surrounding inflammatory stranding involving the distal esophagus possibly related to changes of esophagitis. Infiltrative neoplasm, however, could appear similarly. SA seated shotty periceliac adenopathy with borderline pathologic enlargement may simply be reactive in nature, however, nodal infiltration could appear similarly.  Correlation with endoscopy or barium swallow is recommended. Follow-up CT examination in 3 months may also be helpful to document stability or resolution of adenopathy. Extensive perivesicular inflammatory stranding and circumferential bladder wall thickening again noted in keeping with changes of infectious or inflammatory cystitis. No inflammatory changes seen involving the upper collecting system bilaterally. Correlation with urinalysis and urine culture is recommended. Electronically Signed   By: Helyn Numbers MD   On: 10/28/2020 02:17   Assessment and Plan:   Anterior mediastinal mass The patient is noted to have a 3.1 cm soft tissue mass in the anterior mediastinum Differentials include lymphoma versus thymoma versus germ cell tumor Benign pathology cannot be excluded I do not feel that interventional radiologist would be helpful I recommend consulting CT surgeon for VATS procedure and complete removal of the lesion if possible I will order tumor markers but this is not likely to be helpful  Eosinophilic esophagitis with severe esophageal stenosis GI consulted Discussed with gastroenterology and they anticipate he will need endoscopy this admission  Discharge planning I reviewed the plan of care with the patient He wants inpatient work-up I told him I would not be able to prescribe appropriate treatment until we have results of the biopsy I have reviewed the plan of care with the primary service Please call me if questions arise  Thank you for this referral.   Clenton Pare, DNP, AGPCNP-BC, AOCNP Artis Delay, MD

## 2020-10-28 NOTE — ED Notes (Signed)
Ambulatory to restroom without difficulty. Complaining that his rash is itching, requesting more benadryl. No further requests. Informed of bed status

## 2020-10-28 NOTE — H&P (Addendum)
History and Physical        Hospital Admission Note Date: 10/28/2020  Patient name: Jeffrey Glover Medical record number: [id number] Date of birth: [birth year] Age: 50 y.o. Gender: male  PCP: Patient, No Pcp Per (Inactive)   Chief Complaint    Chief Complaint  Patient presents with  . Abdominal Pain      HPI:   This is a 50 year old with a history of GERD, eosinophilic esophagitis, severe esophageal stenosis male who presented to [facility] on 6/6 with 1 week of epigastric abdominal pain that is worsened with breathing.  Initially had nausea and indigestion associated with a sharp, crampy upper abdominal pain.  The symptoms never seem to go away.  Also associated with poor appetite and abdominal bloating.  Pain is also worsened with eating.  Denies any fever, chest pain, shortness of breath, vomiting, diarrhea.  Of note, he does have a new pruritic rash across his chest that started 6/6.  He has not had any exposure to any known allergens and does not have any known allergies.  Denies any shortness of breath or throat closure sensation.  He also is complaining of some dysuria without hematuria.    ED Course: Afebrile, tachycardic, tachypneic,  hemodynamically stable, on room air.  Notable labs from 6/6: D-dimer 17.9, COVID-19 and flu negative otherwise labs unremarkable. Notable Imaging: CTA chest- 3.1 cm soft tissue mass in the anterior mediastinum, marked circumferential wall thickening and extensive surrounding inflammatory stranding of the distal esophagus possibly related to esophagitis however infiltrative neoplasm could appear similarly, periceliac adenopathy with borderline pathologic enlargement, extensive perivesicular inflammatory stranding and circumferential bladder wall thickening concerning for infectious or inflammatory cystitis, negative for PE. Patient received Maalox, Benadryl,  Protonix, 2 L IV fluid bolus.  GI was consulted by the ED provider   Vitals:   10/28/20 1200 10/28/20 1258  BP: (!) 141/77 137/85  Pulse: 93 96  Resp: 20 16  Temp:  (!) 97.5 F (36.4 C)  SpO2: 96% 98%     Review of Systems:  Review of Systems  All other systems reviewed and are negative.   Medical/Social/Family History   Past Medical History: Past Medical History:  Diagnosis Date  . Dysphagia     Past Surgical History:  Procedure Laterality Date  . ESOPHAGOGASTRODUODENOSCOPY (EGD) WITH PROPOFOL N/A 08/30/2017   Procedure: ESOPHAGOGASTRODUODENOSCOPY (EGD) WITH PROPOFOL;  Surgeon: Sherrilyn Rist, MD;  Location: [facility];  Service: Gastroenterology;  Laterality: N/A;  . UPPER GI ENDOSCOPY  08/03/2017    Medications: Prior to Admission medications   Medication Sig Start Date End Date Taking? Authorizing Provider  ibuprofen (ADVIL,MOTRIN) 200 MG tablet Take 400 mg by mouth every 6 (six) hours as needed for headache or moderate pain.    [provider]  lansoprazole (PREVACID SOLUTAB) 30 MG disintegrating tablet Take 1 tablet (30 mg total) by mouth 2 (two) times daily before a meal. 10/12/17   Danis, Andreas Blower, MD    Allergies:   Allergies  Allergen Reactions  . Other Anaphylaxis and Other (See Comments)    Fresh fish, can't breath    Social History:  reports that he has never smoked. He has never used smokeless tobacco. He  reports current alcohol use. He reports that he does not use drugs.  Family History: Family History  Problem Relation Age of Onset  . Pancreatic cancer Maternal Grandmother      Objective   Physical Exam: Blood pressure 137/85, pulse 96, temperature (!) 97.5 F (36.4 C), temperature source Oral, resp. rate 16, height 6' (1.829 m), weight 104.3 kg, SpO2 98 %.  Physical Exam Vitals and nursing note reviewed.  Constitutional:      Appearance: Normal appearance.  HENT:     Head: Normocephalic and atraumatic.  Eyes:      Conjunctiva/sclera: Conjunctivae normal.  Cardiovascular:     Rate and Rhythm: Normal rate and regular rhythm.  Pulmonary:     Effort: Pulmonary effort is normal.     Breath sounds: Normal breath sounds.  Abdominal:     General: Bowel sounds are normal.     Tenderness: There is abdominal tenderness.  Musculoskeletal:        General: No swelling or tenderness.  Skin:    Coloration: Skin is not jaundiced or pale.     Findings: Rash present.     Comments: Blanchable maculopapular rash across the upper chest  Neurological:     Mental Status: He is alert. Mental status is at baseline.  Psychiatric:        Mood and Affect: Mood normal.        Behavior: Behavior normal.        LABS on Admission: I have personally reviewed all the labs and imaging below    Basic Metabolic Panel: Recent Labs  Lab 10/27/20 1741 10/28/20 1601  NA 135 139  K 4.2 4.4  CL 103 107  CO2 22 24  GLUCOSE 98 75  BUN 14 12  CREATININE 1.05 1.10  CALCIUM 9.1 8.9   Liver Function Tests: Recent Labs  Lab 10/27/20 1741 10/28/20 1601  AST 24 15  ALT 37 28  ALKPHOS 67 62  BILITOT 0.3 0.5  PROT 7.6 6.6  ALBUMIN 3.8 3.3*   Recent Labs  Lab 10/27/20 1741  LIPASE 40   No results for input(s): AMMONIA in the last 168 hours. CBC: Recent Labs  Lab 10/27/20 1741 10/28/20 1601  WBC 9.1 8.2  HGB 15.9 14.6  HCT 48.1 45.3  MCV 87.5 90.1  PLT 306 306   Cardiac Enzymes: No results for input(s): CKTOTAL, CKMB, CKMBINDEX, TROPONINI in the last 168 hours. BNP: Invalid input(s): POCBNP CBG: No results for input(s): GLUCAP in the last 168 hours.  Radiological Exams on Admission:  CT Abdomen Pelvis Wo Contrast  Result Date: 10/27/2020 CLINICAL DATA:  Acute abdominal pain and cramping, nausea EXAM: CT ABDOMEN AND PELVIS WITHOUT CONTRAST TECHNIQUE: Multidetector CT imaging of the abdomen and pelvis was performed following the standard protocol without IV contrast. COMPARISON:  None. FINDINGS: Lower  chest: Bibasilar atelectasis. The visualized heart and pericardium are unremarkable. Moderate hiatal hernia. Circumferential thickening of the distal esophagus may reflect changes of esophagitis, such as reflux esophagitis. Hepatobiliary: No focal liver abnormality is seen. No gallstones, gallbladder wall thickening, or biliary dilatation. Pancreas: Unremarkable Spleen: Unremarkable Adrenals/Urinary Tract: The adrenal glands are unremarkable. The kidneys are normal. There is extensive perivesicular inflammatory stranding and mild circumferential bladder wall thickening in keeping with changes of infectious or inflammatory cystitis. The bladder is largely decompressed. Stomach/Bowel: Stomach is within normal limits. Appendix appears normal. No evidence of bowel wall thickening, distention, or inflammatory changes. No free intraperitoneal gas or fluid. Vascular/Lymphatic: Several shotty periceliac lymph nodes  are identified with a single lymph node demonstrating pathologic enlargement measuring 13 mm in short axis diameter. This is nonspecific, but may relate to the inflammatory changes noted within the region of the gastroesophageal junction. An infiltrative mass with associated nodal involvement, however, could also appear similarly. Shotty retroperitoneal adenopathy is identified within the left periaortic lymph node group without pathologic enlargement. Similarly, several prominent lymph nodes are identified within the ileocecal lymph node group of the mesentery, again without pathologic enlargement. The abdominal vasculature is unremarkable. Reproductive: Prostate is unremarkable. Other: Tiny fat containing umbilical hernia. The rectum is unremarkable. Musculoskeletal: No acute bone abnormality. No lytic or blastic bone lesions are seen. IMPRESSION: Marked perivesicular inflammatory stranding and mild circumferential bladder wall thickening in keeping with an underlying infectious or inflammatory cystitis.  Correlation with urinalysis and urine culture may be helpful. The bladder is not distended. No inflammatory changes identified involving the upper collecting system bilaterally. Small hiatal hernia with marked circumferential thickening involving the distal esophagus, possibly reflecting changes of reflux esophagitis. Note that an infiltrative mass, however, could appear similarly and should be considered given the a particularly prominent periceliac adenopathy. Correlation with barium swallow or endoscopy would be helpful for further evaluation once the patient's acute issues have resolved. Follow-up CT or MRI evaluation may be helpful, additionally, in 3 months to document either stability or resolution of celiac adenopathy if prior examinations are unavailable to confirm stability. Electronically Signed   By: Helyn Numbers MD   On: 10/27/2020 21:25   DG Chest 2 View  Result Date: 10/27/2020 CLINICAL DATA:  Initial evaluation for acute shortness of breath. EXAM: CHEST - 2 VIEW COMPARISON:  Prior radiograph from 08/12/2015. FINDINGS: Cardiac and mediastinal silhouettes are stable, and remain within normal limits. Lungs mildly hypoinflated with elevation of the right hemidiaphragm. Minimal linear bibasilar subsegmental atelectasis. No focal infiltrates. Blunting of the left costophrenic angle suggestive of a trace left pleural effusion. No pulmonary edema. No pneumothorax. No acute osseous finding. IMPRESSION: 1. Trace left pleural effusion. 2. Elevation of the right hemidiaphragm with associated mild bibasilar subsegmental atelectasis. 3. No other active cardiopulmonary disease. Electronically Signed   By: Rise Mu M.D.   On: 10/27/2020 21:12   CT Angio Chest PE W/Cm &/Or Wo Cm  Result Date: 10/28/2020 CLINICAL DATA:  High probability pulmonary embolism. Upper abdominal cramping EXAM: CT ANGIOGRAPHY CHEST CT ABDOMEN AND PELVIS WITH CONTRAST TECHNIQUE: Multidetector CT imaging of the chest was  performed using the standard protocol during bolus administration of intravenous contrast. Multiplanar CT image reconstructions and MIPs were obtained to evaluate the vascular anatomy. Multidetector CT imaging of the abdomen and pelvis was performed using the standard protocol during bolus administration of intravenous contrast. CONTRAST:  OMNIPAQUE IOHEXOL 350 MG/ML SOLN COMPARISON:  9:07 p.m. FINDINGS: CTA CHEST FINDINGS Cardiovascular: There is adequate opacification of the a pulmonary arterial tree. No intraluminal filling defect identified to suggest acute pulmonary embolism. The central pulmonary arteries are of normal caliber. Cardiac size within normal limits. No pericardial effusion. No significant coronary artery calcification. The thoracic aorta is unremarkable. Mediastinum/Nodes: Small hiatal hernia. Marked circumferential thickening of the distal esophagus is present, possibly reflecting changes of esophagitis, such as reflux esophagitis. Note that infiltrative disease could appear similarly. No evidence of obstruction or perforation. A a 2.3 x 2.5 x 3.1 cm soft tissue mass is seen within the anterior mediastinum which is separate from the adjacent left thyroid lobe. This may represent an anterior mediastinal mass such as a thymoma  or lymphoma. No pathologic thoracic adenopathy. The thyroid is unremarkable. Lungs/Pleura: Bibasilar atelectasis. No superimposed focal pulmonary infiltrate. No pneumothorax or pleural effusion. The central airways are widely patent. Musculoskeletal: No chest wall abnormality. No acute or significant osseous findings. Review of the MIP images confirms the above findings. CT ABDOMEN and PELVIS FINDINGS Hepatobiliary: No focal liver abnormality is seen. No gallstones, gallbladder wall thickening, or biliary dilatation. Pancreas: Unremarkable Spleen: Unremarkable Adrenals/Urinary Tract: The adrenal glands are unremarkable. The kidneys are normal. There is extensive  perivesicular inflammatory stranding again identified. Mild circumferential bladder wall thickening is again noted. Together, the findings are in keeping with infectious or inflammatory cystitis. Stomach/Bowel: Stomach is within normal limits. Appendix appears normal. No evidence of bowel wall thickening, distention, or inflammatory changes. No free intraperitoneal gas or fluid. Vascular/Lymphatic: As noted previously, there is shotty periceliac adenopathy with a single pathologically enlarged lymph node measuring 12-13 mm in short axis diameter. This may be reactive given the extensive inflammatory changes involving the gastroesophageal junction, though an infiltrative neoplasm and associated nodal adenopathy could appear similarly. Shotty adenopathy is also noted within the left periaortic lymph node group as well as the ileocecal lymph node group of the mesentery, nonspecific. No additional pathologic adenopathy within the abdomen and pelvis. The abdominal vasculature is unremarkable. Reproductive: Prostate is unremarkable. Other: Tiny fat containing umbilical hernia. The rectum is unremarkable. Musculoskeletal: No acute bone abnormality. No lytic or blastic bone lesion. Review of the MIP images confirms the above findings. IMPRESSION: No pulmonary embolism. 3.1 cm soft tissue mass within the anterior mediastinum. Differential considerations are led by thymoma or lymphoma in a patient of this age. Comparison with prior examinations would be helpful in determining chronicity. If none are available, PET CT examination may be helpful for further management. Marked circumferential wall thickening and extensive surrounding inflammatory stranding involving the distal esophagus possibly related to changes of esophagitis. Infiltrative neoplasm, however, could appear similarly. SA seated shotty periceliac adenopathy with borderline pathologic enlargement may simply be reactive in nature, however, nodal infiltration could  appear similarly. Correlation with endoscopy or barium swallow is recommended. Follow-up CT examination in 3 months may also be helpful to document stability or resolution of adenopathy. Extensive perivesicular inflammatory stranding and circumferential bladder wall thickening again noted in keeping with changes of infectious or inflammatory cystitis. No inflammatory changes seen involving the upper collecting system bilaterally. Correlation with urinalysis and urine culture is recommended. Electronically Signed   By: Helyn Numbers MD   On: 10/28/2020 02:17   CT Abdomen Pelvis W Contrast  Result Date: 10/28/2020 CLINICAL DATA:  High probability pulmonary embolism. Upper abdominal cramping EXAM: CT ANGIOGRAPHY CHEST CT ABDOMEN AND PELVIS WITH CONTRAST TECHNIQUE: Multidetector CT imaging of the chest was performed using the standard protocol during bolus administration of intravenous contrast. Multiplanar CT image reconstructions and MIPs were obtained to evaluate the vascular anatomy. Multidetector CT imaging of the abdomen and pelvis was performed using the standard protocol during bolus administration of intravenous contrast. CONTRAST:  OMNIPAQUE IOHEXOL 350 MG/ML SOLN COMPARISON:  9:07 p.m. FINDINGS: CTA CHEST FINDINGS Cardiovascular: There is adequate opacification of the a pulmonary arterial tree. No intraluminal filling defect identified to suggest acute pulmonary embolism. The central pulmonary arteries are of normal caliber. Cardiac size within normal limits. No pericardial effusion. No significant coronary artery calcification. The thoracic aorta is unremarkable. Mediastinum/Nodes: Small hiatal hernia. Marked circumferential thickening of the distal esophagus is present, possibly reflecting changes of esophagitis, such as reflux esophagitis. Note that  infiltrative disease could appear similarly. No evidence of obstruction or perforation. A a 2.3 x 2.5 x 3.1 cm soft tissue mass is seen within the  anterior mediastinum which is separate from the adjacent left thyroid lobe. This may represent an anterior mediastinal mass such as a thymoma or lymphoma. No pathologic thoracic adenopathy. The thyroid is unremarkable. Lungs/Pleura: Bibasilar atelectasis. No superimposed focal pulmonary infiltrate. No pneumothorax or pleural effusion. The central airways are widely patent. Musculoskeletal: No chest wall abnormality. No acute or significant osseous findings. Review of the MIP images confirms the above findings. CT ABDOMEN and PELVIS FINDINGS Hepatobiliary: No focal liver abnormality is seen. No gallstones, gallbladder wall thickening, or biliary dilatation. Pancreas: Unremarkable Spleen: Unremarkable Adrenals/Urinary Tract: The adrenal glands are unremarkable. The kidneys are normal. There is extensive perivesicular inflammatory stranding again identified. Mild circumferential bladder wall thickening is again noted. Together, the findings are in keeping with infectious or inflammatory cystitis. Stomach/Bowel: Stomach is within normal limits. Appendix appears normal. No evidence of bowel wall thickening, distention, or inflammatory changes. No free intraperitoneal gas or fluid. Vascular/Lymphatic: As noted previously, there is shotty periceliac adenopathy with a single pathologically enlarged lymph node measuring 12-13 mm in short axis diameter. This may be reactive given the extensive inflammatory changes involving the gastroesophageal junction, though an infiltrative neoplasm and associated nodal adenopathy could appear similarly. Shotty adenopathy is also noted within the left periaortic lymph node group as well as the ileocecal lymph node group of the mesentery, nonspecific. No additional pathologic adenopathy within the abdomen and pelvis. The abdominal vasculature is unremarkable. Reproductive: Prostate is unremarkable. Other: Tiny fat containing umbilical hernia. The rectum is unremarkable. Musculoskeletal: No  acute bone abnormality. No lytic or blastic bone lesion. Review of the MIP images confirms the above findings. IMPRESSION: No pulmonary embolism. 3.1 cm soft tissue mass within the anterior mediastinum. Differential considerations are led by thymoma or lymphoma in a patient of this age. Comparison with prior examinations would be helpful in determining chronicity. If none are available, PET CT examination may be helpful for further management. Marked circumferential wall thickening and extensive surrounding inflammatory stranding involving the distal esophagus possibly related to changes of esophagitis. Infiltrative neoplasm, however, could appear similarly. SA seated shotty periceliac adenopathy with borderline pathologic enlargement may simply be reactive in nature, however, nodal infiltration could appear similarly. Correlation with endoscopy or barium swallow is recommended. Follow-up CT examination in 3 months may also be helpful to document stability or resolution of adenopathy. Extensive perivesicular inflammatory stranding and circumferential bladder wall thickening again noted in keeping with changes of infectious or inflammatory cystitis. No inflammatory changes seen involving the upper collecting system bilaterally. Correlation with urinalysis and urine culture is recommended. Electronically Signed   By: Helyn Numbers MD   On: 10/28/2020 02:17   VAS [place] LOWER EXTREMITY VENOUS (DVT)  Result Date: 10/28/2020  Lower Venous DVT Study Patient Name:  GENERAL WEARING  Date of Exam:   10/28/2020 Medical Rec #: [id number]    Accession #:    [id number] Date of Birth: [birth year]    Patient Gender: M Patient Age:   49Y Exam Location:  [facility] Procedure:      VAS [place] LOWER EXTREMITY VENOUS (DVT) Referring Phys: [id number] Tariah Transue E Derhonda [place] --------------------------------------------------------------------------------  Indications: Elevated Ddimer.  Risk Factors: None identified. Comparison Study: No prior studies.  Performing Technologist: Chanda Busing RVT  Examination Guidelines: A complete evaluation includes B-mode imaging, spectral Doppler, color Doppler, and power Doppler as needed of all  accessible portions of each vessel. Bilateral testing is considered an integral part of a complete examination. Limited examinations for reoccurring indications may be performed as noted. The reflux portion of the exam is performed with the patient in reverse Trendelenburg.  +---------+---------------+---------+-----------+----------+--------------+ RIGHT    CompressibilityPhasicitySpontaneityPropertiesThrombus Aging +---------+---------------+---------+-----------+----------+--------------+ CFV      Full           Yes      Yes                                 +---------+---------------+---------+-----------+----------+--------------+ SFJ      Full                                                        +---------+---------------+---------+-----------+----------+--------------+ FV Prox  Full                                                        +---------+---------------+---------+-----------+----------+--------------+ FV Mid   Full                                                        +---------+---------------+---------+-----------+----------+--------------+ FV DistalFull                                                        +---------+---------------+---------+-----------+----------+--------------+ PFV      Full                                                        +---------+---------------+---------+-----------+----------+--------------+ POP      Full           Yes      Yes                                 +---------+---------------+---------+-----------+----------+--------------+ PTV      Full                                                        +---------+---------------+---------+-----------+----------+--------------+ PERO     Full                                                         +---------+---------------+---------+-----------+----------+--------------+   +---------+---------------+---------+-----------+----------+--------------+ LEFT  CompressibilityPhasicitySpontaneityPropertiesThrombus Aging +---------+---------------+---------+-----------+----------+--------------+ CFV      Full           Yes      Yes                                 +---------+---------------+---------+-----------+----------+--------------+ SFJ      Full                                                        +---------+---------------+---------+-----------+----------+--------------+ FV Prox  Full                                                        +---------+---------------+---------+-----------+----------+--------------+ FV Mid   Full                                                        +---------+---------------+---------+-----------+----------+--------------+ FV DistalFull                                                        +---------+---------------+---------+-----------+----------+--------------+ PFV      Full                                                        +---------+---------------+---------+-----------+----------+--------------+ POP      Full           Yes      Yes                                 +---------+---------------+---------+-----------+----------+--------------+ PTV      Full                                                        +---------+---------------+---------+-----------+----------+--------------+ PERO     Full                                                        +---------+---------------+---------+-----------+----------+--------------+     Summary: RIGHT: - There is no evidence of deep vein thrombosis in the lower extremity.  - No cystic structure found in the popliteal fossa.  LEFT: - There is no evidence of deep vein thrombosis in the lower extremity.  - No cystic structure  found in the  popliteal fossa.  *See table(s) above for measurements and observations. Electronically signed by Fabienne Bruns MD on 10/28/2020 at 4:31:02 PM.    Final       EKG: Sinus tachycardia, nonspecific ST and T waves changes in III and aVF   A & P   Principal Problem:   Epigastric pain Active Problems:   Mediastinal mass   Urticaria   Dysuria   Elevated d-dimer   1. Epigastric discomfort of unclear etiology, possibly related to eosinophilic esophagitis a. Continue Protonix 40 mg p.o. daily b. Suspect he may need an EGD c. GI consulted, appreciate recommendations  2. Incidental anterior mediastinal mass a. Oncology consulted and discussed with Dr. Bertis Ruddy, who does not believe this is related to #1 and feels that a biopsy would be warranted, most likely by a cardiothoracic surgeon b. Will consult CT surgery i. Addendum: discussed with CT surgery office for consult. They recommended outpatient follow up with PET scan and tumor markers per oncology orders. Will hold off on PET scan for now until he is over this acute illness  3. Urticaria a. Benadryl as needed  4. Elevated D-dimer a. CTA chest and bilateral lower extremity US negative for DVT b. Possibly related to the mediastinal mass c. Appreciate hematology/oncology input  5. Dysuria a. CT showing extensive perivesicular inflammatory stranding and circumferential bladder wall thickening b. UA negative for infection c. Discussed with Dr. Urban Gibson, urology, continue to monitor for now     DVT prophylaxis: Lovenox   Code Status: Full Code  Diet: Regular Family Communication: Admission, patients condition and plan of care including tests being ordered have been discussed with the patient who indicates understanding and agrees with the plan and Code Status.  Disposition Plan: The appropriate patient status for this patient is OBSERVATION. Observation status is judged to be reasonable and necessary in order to provide the  required intensity of service to ensure the patient's safety. The patient's presenting symptoms, physical exam findings, and initial radiographic and laboratory data in the context of their medical condition is felt to place them at decreased risk for further clinical deterioration. Furthermore, it is anticipated that the patient will be medically stable for discharge from the hospital within 2 midnights of admission. The following factors support the patient status of observation.   " The patient's presenting symptoms include epigastric pain. " The physical exam findings include epigastric pain. " The initial radiographic and laboratory data are anterior mediastinal mass, elevated D-dimer.    Status is: Observation  The patient remains OBS appropriate and will d/c before 2 midnights.  Dispo: The patient is from: Home              Anticipated d/c is to: Home              Patient currently is not medically stable to d/c.   Difficult to place patient No         The medical decision making on this patient was of high complexity and the patient is at high risk for clinical deterioration, therefore this is a level 3  admission.  Consultants  . Oncology . GI . CT surgery  Procedures  . None  Time Spent on Admission: 73 minutes    Jae Dire, DO [facility]  10/28/2020, 4:46 PM

## 2020-10-29 ENCOUNTER — Observation Stay (HOSPITAL_COMMUNITY): Payer: Self-pay | Admitting: Certified Registered Nurse Anesthetist

## 2020-10-29 ENCOUNTER — Other Ambulatory Visit (HOSPITAL_COMMUNITY): Payer: Self-pay | Admitting: Hematology and Oncology

## 2020-10-29 ENCOUNTER — Encounter: Payer: Self-pay | Admitting: *Deleted

## 2020-10-29 ENCOUNTER — Encounter (HOSPITAL_COMMUNITY): Payer: Self-pay | Admitting: Internal Medicine

## 2020-10-29 ENCOUNTER — Encounter (HOSPITAL_COMMUNITY)
Admission: EM | Disposition: A | Discharge status: Home / Self Care | Payer: Self-pay | Source: Home / Self Care | Attending: Internal Medicine

## 2020-10-29 DIAGNOSIS — K200 Eosinophilic esophagitis: Secondary | ICD-10-CM

## 2020-10-29 DIAGNOSIS — R1310 Dysphagia, unspecified: Secondary | ICD-10-CM

## 2020-10-29 DIAGNOSIS — R1319 Other dysphagia: Secondary | ICD-10-CM

## 2020-10-29 DIAGNOSIS — K222 Esophageal obstruction: Principal | ICD-10-CM

## 2020-10-29 DIAGNOSIS — K2970 Gastritis, unspecified, without bleeding: Secondary | ICD-10-CM

## 2020-10-29 DIAGNOSIS — J9859 Other diseases of mediastinum, not elsewhere classified: Secondary | ICD-10-CM

## 2020-10-29 HISTORY — PX: ESOPHAGOGASTRODUODENOSCOPY (EGD) WITH PROPOFOL: SHX5813

## 2020-10-29 HISTORY — PX: BIOPSY: SHX5522

## 2020-10-29 HISTORY — PX: ESOPHAGEAL DILATION: SHX303

## 2020-10-29 LAB — BASIC METABOLIC PANEL
Anion gap: 9 (ref 5–15)
BUN: 10 mg/dL (ref 6–20)
CO2: 22 mmol/L (ref 22–32)
Calcium: 8.8 mg/dL — ABNORMAL LOW (ref 8.9–10.3)
Chloride: 107 mmol/L (ref 98–111)
Creatinine, Ser: 1.19 mg/dL (ref 0.61–1.24)
GFR, Estimated: >60 mL/min (ref >60)
Glucose, Bld: 90 mg/dL (ref 70–99)
Potassium: 4.3 mmol/L (ref 3.5–5.1)
Sodium: 138 mmol/L (ref 135–145)

## 2020-10-29 LAB — CBC
HCT: 42.1 % (ref 39.0–52.0)
Hemoglobin: 13.4 g/dL (ref 13.0–17.0)
MCH: 28.9 pg (ref 26.0–34.0)
MCHC: 31.8 g/dL (ref 30.0–36.0)
MCV: 90.9 fL (ref 80.0–100.0)
Platelets: 286 10*3/uL (ref 150–400)
RBC: 4.63 MIL/uL (ref 4.22–5.81)
RDW: 13.1 % (ref 11.5–15.5)
WBC: 7.7 10*3/uL (ref 4.0–10.5)
nRBC: 0 % (ref 0.0–0.2)

## 2020-10-29 LAB — LACTATE DEHYDROGENASE: LDH: 170 U/L (ref 98–192)

## 2020-10-29 LAB — URINE CULTURE: Culture: NO GROWTH

## 2020-10-29 SURGERY — ESOPHAGOGASTRODUODENOSCOPY (EGD) WITH PROPOFOL
Anesthesia: Monitor Anesthesia Care

## 2020-10-29 MED ORDER — PANTOPRAZOLE SODIUM 40 MG PO TBEC: 40.0000 mg | DELAYED_RELEASE_TABLET | Freq: Two times a day (BID) | ORAL | 1 refills | Status: DC

## 2020-10-29 MED ORDER — PROPOFOL 500 MG/50ML IV EMUL
INTRAVENOUS | Status: DC | PRN
  Administered 2020-10-29: 200 ug/kg/min via INTRAVENOUS

## 2020-10-29 MED ORDER — LIDOCAINE 2% (20 MG/ML) 5 ML SYRINGE
INTRAMUSCULAR | Status: DC | PRN
  Administered 2020-10-29: 100 mg via INTRAVENOUS

## 2020-10-29 MED ORDER — ACETAMINOPHEN 325 MG PO TABS: 650.0000 mg | ORAL_TABLET | Freq: Four times a day (QID) | ORAL | Status: DC | PRN

## 2020-10-29 MED ORDER — PANTOPRAZOLE SODIUM 40 MG PO TBEC
40.0000 mg | DELAYED_RELEASE_TABLET | Freq: Two times a day (BID) | ORAL | Status: DC
  Administered 2020-10-29 – 2020-10-30 (×2): 40 mg via ORAL
  Filled 2020-10-29 (×2): qty 1

## 2020-10-29 MED ORDER — LACTATED RINGERS IV SOLN: INTRAVENOUS | Status: DC | PRN

## 2020-10-29 MED ORDER — PROPOFOL 10 MG/ML IV BOLUS
INTRAVENOUS | Status: DC | PRN
  Administered 2020-10-29: 30 mg via INTRAVENOUS
  Administered 2020-10-29 (×2): 40 mg via INTRAVENOUS
  Administered 2020-10-29: 20 mg via INTRAVENOUS

## 2020-10-29 SURGICAL SUPPLY — 15 items

## 2020-10-29 NOTE — Progress Notes (Signed)
Dr. Bertis Ruddy completed PET order.  I notified authorization team to get scan auth.  Wait for response.

## 2020-10-29 NOTE — Anesthesia Preprocedure Evaluation (Addendum)
Anesthesia Evaluation  Patient identified by MRN, date of birth, ID band Patient awake    Reviewed: Allergy & Precautions, NPO status , Patient's Chart, lab work & pertinent test results  Airway Mallampati: II  TM Distance: >3 FB Neck ROM: Full    Dental no notable dental hx.    Pulmonary  Mediastinal mass   Pulmonary exam normal breath sounds clear to auscultation- rhonchi + decreased breath sounds      Cardiovascular negative cardio ROS Normal cardiovascular exam Rhythm:Regular Rate:Normal     Neuro/Psych negative neurological ROS  negative psych ROS   GI/Hepatic Neg liver ROS, Abnormal esophagus   Endo/Other  negative endocrine ROS  Renal/GU negative Renal ROS  negative genitourinary   Musculoskeletal negative musculoskeletal ROS (+)   Abdominal   Peds negative pediatric ROS (+)  Hematology negative hematology ROS (+)   Anesthesia Other Findings pruitic rash  Reproductive/Obstetrics negative OB ROS                            Anesthesia Physical Anesthesia Plan  ASA: II  Anesthesia Plan:    Post-op Pain Management:    Induction: Intravenous  PONV Risk Score and Plan: Propofol infusion, TIVA and Treatment may vary due to age or medical condition  Airway Management Planned: Natural Airway and Nasal Cannula  Additional Equipment:   Intra-op Plan:   Post-operative Plan:   Informed Consent: I have reviewed the patients History and Physical, chart, labs and discussed the procedure including the risks, benefits and alternatives for the proposed anesthesia with the patient or authorized representative who has indicated his/her understanding and acceptance.     Dental advisory given  Plan Discussed with: CRNA and Anesthesiologist  Anesthesia Plan Comments:         Anesthesia Quick Evaluation

## 2020-10-29 NOTE — Op Note (Signed)
[facility] Patient Name: Jeffrey Glover Procedure Date: 10/29/2020 MRN: [id number] Attending MD: Beverley Fiedler , MD Date of Birth: Dec 31, 1970 CSN: [id number] Age: 50 Admit Type: Inpatient Procedure:                Upper GI endoscopy Indications:              Epigastric abdominal pain, Dysphagia, history of                            EoE with prior stricture seen by EGD in 2019 and                            barium esophagram, newly diagnosed mediastinal mass                            by CT chest Providers:                Carie Caddy. Rhea Belton, MD, Blenda Mounts, RN, Arlee Muslim Tech., Technician, Steffanie Dunn CRNA Referring MD:             [facility] Medicines:                Monitored Anesthesia Care Complications:            No immediate complications. Estimated Blood Loss:     Estimated blood loss was minimal. Procedure:                Pre-Anesthesia Assessment:                           - Prior to the procedure, a History and Physical                            was performed, and patient medications and                            allergies were reviewed. The patient's tolerance of                            previous anesthesia was also reviewed. The risks                            and benefits of the procedure and the sedation                            options and risks were discussed with the patient.                            All questions were answered, and informed consent                            was obtained. Prior Anticoagulants: The patient has  taken no previous anticoagulant or antiplatelet                            agents. ASA Grade Assessment: II - A patient with                            mild systemic disease. After reviewing the risks                            and benefits, the patient was deemed in                            satisfactory condition to undergo the procedure.                            After obtaining informed consent, the endoscope was                            passed under direct vision. Throughout the                            procedure, the patient's blood pressure, pulse, and                            oxygen saturations were monitored continuously. The                            GIF-H190 ([id number] Olympus gastroscope was                            introduced through the mouth, and advanced to the                            mid esophagus. An stricture was encountered that                            could not be passed. The scope was changed to the                            ultra slim gastroscope and passed easily into the                            second part of duodenum. The upper GI endoscopy was                            accomplished without difficulty. The patient                            tolerated the procedure well. Scope In: Scope Out: Findings:      Mucosal changes including ringed esophagus, multiple punctate traction       diverticula and stenosis were found in the mid esophagus and in the       distal esophagus. Esophageal findings were graded using the Eosinophilic  Esophagitis Endoscopic Reference Score (EoE-EREFS) as: Edema Grade 1       Present (decreased clarity or absence of vascular markings), Rings Grade       2 Moderate (distinct rings that do not occlude passage of diagnostic       8-10 mm endoscope), Exudates Grade 0 None (no white lesions seen),       Furrows Grade 0 None (no vertical lines seen) and Stricture present (9       mm luminal diameter). The stricture is located in the mid esophagus at       29 cm from the incisors (though there are multiple circumferential rings       in this segment. Given the adult EGD scope would not pass the entire EGD       was 1st performed with the ultra slim gastroscope before dilation. A TTS       dilator was passed through the scope. Dilation with a 03-04-11 mm x 5.5       cm  CRE balloon dilator was performed 1st 10 mm (after which inspection       revealed no change), then to 11 mm (after which inspection revealed no       change ) and finally to 12 mm. The dilation site was examined and showed       moderate mucosal disruption and moderate improvement in luminal       narrowing. After 12 mm dilation the adult upper endoscope passed easily       into the stomach. Biopsies were obtained from the proximal and distal       esophagus with cold forceps for histology of suspected eosinophilic       esophagitis.      Diffuse moderate inflammation characterized by congestion (edema),       erythema and granularity was found in the entire examined stomach.       Biopsies were taken with a cold forceps for histology and Helicobacter       pylori testing.      The examined duodenum was normal. Impression:               - Esophageal mucosal changes secondary to                            eosinophilic esophagitis.                           - Mid esophageal stricture initially preventing                            passage of the adult upper endoscope. Dilated to 12                            mm with balloon. Biopsied.                           - Gastritis. Biopsied.                           - Normal examined duodenum. Moderate Sedation:      N/A Recommendation:           - Return patient to hospital ward for ongoing care.                           -  Clear liquid diet. Advanced diet as tolerated in                            2 hours post-dilation.                           - Continue present medications. BID PPI.                           - Await pathology results.                           - Office follow-up with Dr. Myrtie Neither is recommended.                           - CT surgery consultation per oncology for further                            evaluation of anterior mediastinal mass. Procedure Code(s):        --- Professional ---                           [phone],  Esophagogastroduodenoscopy, flexible,                            transoral; with transendoscopic balloon dilation of                            esophagus (less than 30 mm diameter)                           [id number], 59, Esophagogastroduodenoscopy, flexible,                            transoral; with biopsy, single or multiple Diagnosis Code(s):        --- Professional ---                           K20.0, Eosinophilic esophagitis                           K29.70, Gastritis, unspecified, without bleeding                           R10.13, Epigastric pain                           R13.10, Dysphagia, unspecified CPT copyright 2019 [employer]. All rights reserved. The codes documented in this report are preliminary and upon coder review may  be revised to meet current compliance requirements. Beverley Fiedler, MD 10/29/2020 3:30:09 PM This report has been signed electronically. Number of Addenda: 0

## 2020-10-29 NOTE — Transfer of Care (Signed)
Immediate Anesthesia Transfer of Care Note  Patient: Jeffrey Glover  Procedure(s) Performed: ESOPHAGOGASTRODUODENOSCOPY (EGD) WITH PROPOFOL (N/A )  Patient Location: Endoscopy Unit  Anesthesia Type:MAC  Level of Consciousness: drowsy  Airway & Oxygen Therapy: Patient Spontanous Breathing and Patient connected to face mask oxygen  Post-op Assessment: Report given to RN, Post -op Vital signs reviewed and stable and Patient moving all extremities  Post vital signs: Reviewed and stable  Last Vitals:  Vitals Value Taken Time  BP 117/50 10/29/20 1519  Temp 36.7 C 10/29/20 1519  Pulse 93 10/29/20 1519  Resp 21 10/29/20 1520  SpO2 97 % 10/29/20 1519  Vitals shown include unvalidated device data.  Last Pain:  Vitals:   10/29/20 1519  TempSrc: Oral  PainSc: Asleep         Complications: No complications documented.

## 2020-10-29 NOTE — Progress Notes (Signed)
PROGRESS NOTE    Jeffrey Glover  RUE:[id number] DOB: [birth year] DOA: 10/27/2020 PCP: Patient, No Pcp Per (Inactive)  Brief Narrative: 50 year old male with history of severe eosinophilic esophagitis, esophageal stenosis diagnosed in 2019, treated with PPI therapy, was not felt to be amenable to dilation, was advised to follow-up with [facility], was subsequently lost to follow-up. -Presented to the ED on 6/7 with worsening heartburn, nausea, epigastric abdominal pain. -Imaging with a CT noted severe extensive inflammation involving the GE junction, and 3.1 cm soft tissue mass in the anterior mediastinum   Assessment & Plan:   Esophagitis, epigastric abdominal pain, weight loss History of severe eosinophilic esophagitis -Continue PPI, plan for endoscopy today, will need repeat biopsies -Referral to allergy immunology again at discharge  Anterior mediastinal mass -Unclear if this could be secondary to above, await above biopsies -Case discussed by admitting MD with CT surgery, recommended outpatient follow-up with PET scan, tumor markers per oncology   DVT prophylaxis: SCDs Code Status: Full code Family Communication: Discussed patient in detail, no family at bedside Disposition Plan:  Status is: Observation  The patient will require care spanning > 2 midnights and should be moved to inpatient because: Inpatient level of care appropriate due to severity of illness  Dispo: The patient is from: Home              Anticipated d/c is to: Home              Patient currently is not medically stable to d/c.   Difficult to place patient No   Consultants:   Gastroenterology   Procedures:   Antimicrobials:    Subjective: -Feels okay, has intermittent odynophagia, epigastric pain  Objective: Vitals:   10/28/20 1725 10/28/20 2218 10/29/20 0104 10/29/20 0605  BP: 138/83 130/78 122/66 109/64  Pulse: 90 (!) 103 98 86  Resp: Temp: 98.1 F (36.7 C)  99.3 F (37.4 C) 99.4 F (37.4 C) 98 F (36.7 C)  TempSrc: Oral Oral Oral Oral  SpO2: 99% 97% 97% 97%  Weight:      Height:        Intake/Output Summary (Last 24 hours) at 10/29/2020 1244 Last data filed at 10/29/2020 1000 Gross per 24 hour  Intake 1153.3 ml  Output 0 ml  Net 1153.3 ml   Filed Weights   10/27/20 1727  Weight: 104.3 kg    Examination:  General exam: Pleasant young male, sitting up in bed, AAOx3, no distress CVS: S1-S2, regular rate rhythm Lungs: Clear bilaterally Abdomen: Soft, nontender, bowel sounds present Extremities: No edema Skin: No rash on exposed skin  Psychiatry: Judgement and insight appear normal. Mood & affect appropriate.     Data Reviewed:   CBC: Recent Labs  Lab 10/27/20 1741 10/28/20 1601 10/29/20 0447  WBC 9.1 8.2 7.7  HGB 15.9 14.6 13.4  HCT 48.1 45.3 42.1  MCV 87.5 90.1 90.9  PLT 306 306 286   Basic Metabolic Panel: Recent Labs  Lab 10/27/20 1741 10/28/20 1601 10/29/20 0447  NA 135 139 138  K 4.2 4.4 4.3  CL 103 107 107  CO2 GLUCOSE 98 75 90  BUN CREATININE 1.05 1.10 1.19  CALCIUM 9.1 8.9 8.8*   GFR: Estimated Creatinine Clearance: 94.8 mL/min (by C-G formula based on SCr of 1.19 mg/dL). Liver Function Tests: Recent Labs  Lab 10/27/20 1741 10/28/20 1601  AST 24 15  ALT 37 28  ALKPHOS 67 62  BILITOT 0.3 0.5  PROT 7.6 6.6  ALBUMIN 3.8 3.3*   Recent Labs  Lab 10/27/20 1741  LIPASE 40   No results for input(s): AMMONIA in the last 168 hours. Coagulation Profile: No results for input(s): INR, PROTIME in the last 168 hours. Cardiac Enzymes: No results for input(s): CKTOTAL, CKMB, CKMBINDEX, TROPONINI in the last 168 hours. BNP (last 3 results) No results for input(s): PROBNP in the last 8760 hours. HbA1C: No results for input(s): HGBA1C in the last 72 hours. CBG: No results for input(s): GLUCAP in the last 168 hours. Lipid Profile: No results for input(s): CHOL, HDL, LDLCALC,  TRIG, CHOLHDL, LDLDIRECT in the last 72 hours. Thyroid Function Tests: No results for input(s): TSH, T4TOTAL, FREET4, T3FREE, THYROIDAB in the last 72 hours. Anemia Panel: No results for input(s): VITAMINB12, FOLATE, FERRITIN, TIBC, IRON, RETICCTPCT in the last 72 hours. Urine analysis:    Component Value Date/Time   COLORURINE YELLOW 10/27/2020 1936   APPEARANCEUR CLEAR 10/27/2020 1936   LABSPEC >1.030 (H) 10/27/2020 1936   PHURINE 5.0 10/27/2020 1936   GLUCOSEU NEGATIVE 10/27/2020 1936   HGBUR NEGATIVE 10/27/2020 1936   BILIRUBINUR NEGATIVE 10/27/2020 1936   KETONESUR 15 (A) 10/27/2020 1936   PROTEINUR NEGATIVE 10/27/2020 1936   NITRITE NEGATIVE 10/27/2020 1936   LEUKOCYTESUR NEGATIVE 10/27/2020 1936   Sepsis Labs: @LABRCNTIP (procalcitonin:4,lacticidven:4)  ) Recent Results (from the past 240 hour(s))  Urine culture     Status: None   Collection Time: 10/27/20 10:18 PM   Specimen: Urine, Random  Result Value Ref Range Status   Specimen Description   Final    URINE, RANDOM Performed at [facility] [place], [address] [place], [place] [address]    Special Requests   Final    NONE Performed at [facility], [address] [place], [place] [address]    Culture   Final    NO GROWTH Performed at [facility], [address] [place], [place] [address]    Report Status 10/29/2020 FINAL  Final  Resp Panel by RT-PCR (Flu A&B, Covid) Nasopharyngeal Swab     Status: None   Collection Time: 10/28/20 12:25 AM   Specimen: Nasopharyngeal Swab; Nasopharyngeal(NP) swabs in vial transport medium  Result Value Ref Range Status   SARS Coronavirus 2 by RT PCR NEGATIVE NEGATIVE Final    Comment: (NOTE) SARS-CoV-2 target nucleic acids are NOT DETECTED.  The SARS-CoV-2 RNA is generally detectable in upper respiratory specimens during the acute phase of infection. The lowest concentration of SARS-CoV-2 viral copies this assay can detect is 138 copies/mL. A  negative result does not preclude SARS-Cov-2 infection and should not be used as the sole basis for treatment or other patient management decisions. A negative result may occur with  improper specimen collection/handling, submission of specimen other than nasopharyngeal swab, presence of viral mutation(s) within the areas targeted by this assay, and inadequate number of viral copies(<138 copies/mL). A negative result must be combined with clinical observations, patient history, and epidemiological information. The expected result is Negative.  Fact Sheet for Patients:  BloggerCourse.comhttps://www.fda.gov/media/152166/download  Fact Sheet for Healthcare Providers:  SeriousBroker.ithttps://www.fda.gov/media/152162/download  This test is no t yet approved or cleared by the [place] FDA and  has been authorized for detection and/or diagnosis of SARS-CoV-2 by FDA under an Emergency Use Authorization (EUA). This EUA will remain  in effect (meaning this test can be used) for the duration of the COVID-19 declaration under  Section 564(b)(1) of the Act, 21 U.S.C.section 360bbb-3(b)(1), unless the authorization is terminated  or revoked sooner.       Influenza A by PCR NEGATIVE NEGATIVE Final   Influenza B by PCR NEGATIVE NEGATIVE Final    Comment: (NOTE) The Xpert Xpress SARS-CoV-2/FLU/RSV plus assay is intended as an aid in the diagnosis of influenza from Nasopharyngeal swab specimens and should not be used as a sole basis for treatment. Nasal washings and aspirates are unacceptable for Xpert Xpress SARS-CoV-2/FLU/RSV testing.  Fact Sheet for Patients: BloggerCourse.com  Fact Sheet for Healthcare Providers: SeriousBroker.it  This test is not yet approved or cleared by the [place] FDA and has been authorized for detection and/or diagnosis of SARS-CoV-2 by FDA under an Emergency Use Authorization (EUA). This EUA will remain in effect (meaning this test can  be used) for the duration of the COVID-19 declaration under Section 564(b)(1) of the Act, 21 U.S.C. section 360bbb-3(b)(1), unless the authorization is terminated or revoked.  Performed at [facility], [address] [place], [place] [address]          Radiology Studies: CT Abdomen Pelvis Wo Contrast  Result Date: 10/27/2020 CLINICAL DATA:  Acute abdominal pain and cramping, nausea EXAM: CT ABDOMEN AND PELVIS WITHOUT CONTRAST TECHNIQUE: Multidetector CT imaging of the abdomen and pelvis was performed following the standard protocol without IV contrast. COMPARISON:  None. FINDINGS: Lower chest: Bibasilar atelectasis. The visualized heart and pericardium are unremarkable. Moderate hiatal hernia. Circumferential thickening of the distal esophagus may reflect changes of esophagitis, such as reflux esophagitis. Hepatobiliary: No focal liver abnormality is seen. No gallstones, gallbladder wall thickening, or biliary dilatation. Pancreas: Unremarkable Spleen: Unremarkable Adrenals/Urinary Tract: The adrenal glands are unremarkable. The kidneys are normal. There is extensive perivesicular inflammatory stranding and mild circumferential bladder wall thickening in keeping with changes of infectious or inflammatory cystitis. The bladder is largely decompressed. Stomach/Bowel: Stomach is within normal limits. Appendix appears normal. No evidence of bowel wall thickening, distention, or inflammatory changes. No free intraperitoneal gas or fluid. Vascular/Lymphatic: Several shotty periceliac lymph nodes are identified with a single lymph node demonstrating pathologic enlargement measuring 13 mm in short axis diameter. This is nonspecific, but may relate to the inflammatory changes noted within the region of the gastroesophageal junction. An infiltrative mass with associated nodal involvement, however, could also appear similarly. Shotty retroperitoneal adenopathy is identified within the left  periaortic lymph node group without pathologic enlargement. Similarly, several prominent lymph nodes are identified within the ileocecal lymph node group of the mesentery, again without pathologic enlargement. The abdominal vasculature is unremarkable. Reproductive: Prostate is unremarkable. Other: Tiny fat containing umbilical hernia. The rectum is unremarkable. Musculoskeletal: No acute bone abnormality. No lytic or blastic bone lesions are seen. IMPRESSION: Marked perivesicular inflammatory stranding and mild circumferential bladder wall thickening in keeping with an underlying infectious or inflammatory cystitis. Correlation with urinalysis and urine culture may be helpful. The bladder is not distended. No inflammatory changes identified involving the upper collecting system bilaterally. Small hiatal hernia with marked circumferential thickening involving the distal esophagus, possibly reflecting changes of reflux esophagitis. Note that an infiltrative mass, however, could appear similarly and should be considered given the a particularly prominent periceliac adenopathy. Correlation with barium swallow or endoscopy would be helpful for further evaluation once the patient's acute issues have resolved. Follow-up CT or MRI evaluation may be helpful, additionally, in 3 months to document either stability or resolution of celiac adenopathy if prior examinations are unavailable to confirm  stability. Electronically Signed   By: Helyn Numbers MD   On: 10/27/2020 21:25   DG Chest 2 View  Result Date: 10/27/2020 CLINICAL DATA:  Initial evaluation for acute shortness of breath. EXAM: CHEST - 2 VIEW COMPARISON:  Prior radiograph from 08/12/2015. FINDINGS: Cardiac and mediastinal silhouettes are stable, and remain within normal limits. Lungs mildly hypoinflated with elevation of the right hemidiaphragm. Minimal linear bibasilar subsegmental atelectasis. No focal infiltrates. Blunting of the left costophrenic angle  suggestive of a trace left pleural effusion. No pulmonary edema. No pneumothorax. No acute osseous finding. IMPRESSION: 1. Trace left pleural effusion. 2. Elevation of the right hemidiaphragm with associated mild bibasilar subsegmental atelectasis. 3. No other active cardiopulmonary disease. Electronically Signed   By: Rise Mu M.D.   On: 10/27/2020 21:12   CT Angio Chest PE W/Cm &/Or Wo Cm  Result Date: 10/28/2020 CLINICAL DATA:  High probability pulmonary embolism. Upper abdominal cramping EXAM: CT ANGIOGRAPHY CHEST CT ABDOMEN AND PELVIS WITH CONTRAST TECHNIQUE: Multidetector CT imaging of the chest was performed using the standard protocol during bolus administration of intravenous contrast. Multiplanar CT image reconstructions and MIPs were obtained to evaluate the vascular anatomy. Multidetector CT imaging of the abdomen and pelvis was performed using the standard protocol during bolus administration of intravenous contrast. CONTRAST:  OMNIPAQUE IOHEXOL 350 MG/ML SOLN COMPARISON:  9:07 p.m. FINDINGS: CTA CHEST FINDINGS Cardiovascular: There is adequate opacification of the a pulmonary arterial tree. No intraluminal filling defect identified to suggest acute pulmonary embolism. The central pulmonary arteries are of normal caliber. Cardiac size within normal limits. No pericardial effusion. No significant coronary artery calcification. The thoracic aorta is unremarkable. Mediastinum/Nodes: Small hiatal hernia. Marked circumferential thickening of the distal esophagus is present, possibly reflecting changes of esophagitis, such as reflux esophagitis. Note that infiltrative disease could appear similarly. No evidence of obstruction or perforation. A a 2.3 x 2.5 x 3.1 cm soft tissue mass is seen within the anterior mediastinum which is separate from the adjacent left thyroid lobe. This may represent an anterior mediastinal mass such as a thymoma or lymphoma. No pathologic thoracic adenopathy.  The thyroid is unremarkable. Lungs/Pleura: Bibasilar atelectasis. No superimposed focal pulmonary infiltrate. No pneumothorax or pleural effusion. The central airways are widely patent. Musculoskeletal: No chest wall abnormality. No acute or significant osseous findings. Review of the MIP images confirms the above findings. CT ABDOMEN and PELVIS FINDINGS Hepatobiliary: No focal liver abnormality is seen. No gallstones, gallbladder wall thickening, or biliary dilatation. Pancreas: Unremarkable Spleen: Unremarkable Adrenals/Urinary Tract: The adrenal glands are unremarkable. The kidneys are normal. There is extensive perivesicular inflammatory stranding again identified. Mild circumferential bladder wall thickening is again noted. Together, the findings are in keeping with infectious or inflammatory cystitis. Stomach/Bowel: Stomach is within normal limits. Appendix appears normal. No evidence of bowel wall thickening, distention, or inflammatory changes. No free intraperitoneal gas or fluid. Vascular/Lymphatic: As noted previously, there is shotty periceliac adenopathy with a single pathologically enlarged lymph node measuring 12-13 mm in short axis diameter. This may be reactive given the extensive inflammatory changes involving the gastroesophageal junction, though an infiltrative neoplasm and associated nodal adenopathy could appear similarly. Shotty adenopathy is also noted within the left periaortic lymph node group as well as the ileocecal lymph node group of the mesentery, nonspecific. No additional pathologic adenopathy within the abdomen and pelvis. The abdominal vasculature is unremarkable. Reproductive: Prostate is unremarkable. Other: Tiny fat containing umbilical hernia. The rectum is unremarkable. Musculoskeletal: No acute bone abnormality. No lytic  or blastic bone lesion. Review of the MIP images confirms the above findings. IMPRESSION: No pulmonary embolism. 3.1 cm soft tissue mass within the anterior  mediastinum. Differential considerations are led by thymoma or lymphoma in a patient of this age. Comparison with prior examinations would be helpful in determining chronicity. If none are available, PET CT examination may be helpful for further management. Marked circumferential wall thickening and extensive surrounding inflammatory stranding involving the distal esophagus possibly related to changes of esophagitis. Infiltrative neoplasm, however, could appear similarly. SA seated shotty periceliac adenopathy with borderline pathologic enlargement may simply be reactive in nature, however, nodal infiltration could appear similarly. Correlation with endoscopy or barium swallow is recommended. Follow-up CT examination in 3 months may also be helpful to document stability or resolution of adenopathy. Extensive perivesicular inflammatory stranding and circumferential bladder wall thickening again noted in keeping with changes of infectious or inflammatory cystitis. No inflammatory changes seen involving the upper collecting system bilaterally. Correlation with urinalysis and urine culture is recommended. Electronically Signed   By: Helyn Numbers MD   On: 10/28/2020 02:17   CT Abdomen Pelvis W Contrast  Result Date: 10/28/2020 CLINICAL DATA:  High probability pulmonary embolism. Upper abdominal cramping EXAM: CT ANGIOGRAPHY CHEST CT ABDOMEN AND PELVIS WITH CONTRAST TECHNIQUE: Multidetector CT imaging of the chest was performed using the standard protocol during bolus administration of intravenous contrast. Multiplanar CT image reconstructions and MIPs were obtained to evaluate the vascular anatomy. Multidetector CT imaging of the abdomen and pelvis was performed using the standard protocol during bolus administration of intravenous contrast. CONTRAST:  OMNIPAQUE IOHEXOL 350 MG/ML SOLN COMPARISON:  9:07 p.m. FINDINGS: CTA CHEST FINDINGS Cardiovascular: There is adequate opacification of the a pulmonary arterial  tree. No intraluminal filling defect identified to suggest acute pulmonary embolism. The central pulmonary arteries are of normal caliber. Cardiac size within normal limits. No pericardial effusion. No significant coronary artery calcification. The thoracic aorta is unremarkable. Mediastinum/Nodes: Small hiatal hernia. Marked circumferential thickening of the distal esophagus is present, possibly reflecting changes of esophagitis, such as reflux esophagitis. Note that infiltrative disease could appear similarly. No evidence of obstruction or perforation. A a 2.3 x 2.5 x 3.1 cm soft tissue mass is seen within the anterior mediastinum which is separate from the adjacent left thyroid lobe. This may represent an anterior mediastinal mass such as a thymoma or lymphoma. No pathologic thoracic adenopathy. The thyroid is unremarkable. Lungs/Pleura: Bibasilar atelectasis. No superimposed focal pulmonary infiltrate. No pneumothorax or pleural effusion. The central airways are widely patent. Musculoskeletal: No chest wall abnormality. No acute or significant osseous findings. Review of the MIP images confirms the above findings. CT ABDOMEN and PELVIS FINDINGS Hepatobiliary: No focal liver abnormality is seen. No gallstones, gallbladder wall thickening, or biliary dilatation. Pancreas: Unremarkable Spleen: Unremarkable Adrenals/Urinary Tract: The adrenal glands are unremarkable. The kidneys are normal. There is extensive perivesicular inflammatory stranding again identified. Mild circumferential bladder wall thickening is again noted. Together, the findings are in keeping with infectious or inflammatory cystitis. Stomach/Bowel: Stomach is within normal limits. Appendix appears normal. No evidence of bowel wall thickening, distention, or inflammatory changes. No free intraperitoneal gas or fluid. Vascular/Lymphatic: As noted previously, there is shotty periceliac adenopathy with a single pathologically enlarged lymph node  measuring 12-13 mm in short axis diameter. This may be reactive given the extensive inflammatory changes involving the gastroesophageal junction, though an infiltrative neoplasm and associated nodal adenopathy could appear similarly. Shotty adenopathy is also noted within the left periaortic lymph  node group as well as the ileocecal lymph node group of the mesentery, nonspecific. No additional pathologic adenopathy within the abdomen and pelvis. The abdominal vasculature is unremarkable. Reproductive: Prostate is unremarkable. Other: Tiny fat containing umbilical hernia. The rectum is unremarkable. Musculoskeletal: No acute bone abnormality. No lytic or blastic bone lesion. Review of the MIP images confirms the above findings. IMPRESSION: No pulmonary embolism. 3.1 cm soft tissue mass within the anterior mediastinum. Differential considerations are led by thymoma or lymphoma in a patient of this age. Comparison with prior examinations would be helpful in determining chronicity. If none are available, PET CT examination may be helpful for further management. Marked circumferential wall thickening and extensive surrounding inflammatory stranding involving the distal esophagus possibly related to changes of esophagitis. Infiltrative neoplasm, however, could appear similarly. SA seated shotty periceliac adenopathy with borderline pathologic enlargement may simply be reactive in nature, however, nodal infiltration could appear similarly. Correlation with endoscopy or barium swallow is recommended. Follow-up CT examination in 3 months may also be helpful to document stability or resolution of adenopathy. Extensive perivesicular inflammatory stranding and circumferential bladder wall thickening again noted in keeping with changes of infectious or inflammatory cystitis. No inflammatory changes seen involving the upper collecting system bilaterally. Correlation with urinalysis and urine culture is recommended. Electronically  Signed   By: Helyn Numbers MD   On: 10/28/2020 02:17   VAS [place] LOWER EXTREMITY VENOUS (DVT)  Result Date: 10/28/2020  Lower Venous DVT Study Patient Name:  Jeffrey Glover  Date of Exam:   10/28/2020 Medical Rec #: [id number]    Accession #:    [id number] Date of Birth: 28-Oct-1970    Patient Gender: M Patient Age:   49Y Exam Location:  [facility] Procedure:      VAS [place] LOWER EXTREMITY VENOUS (DVT) Referring Phys: [id number] JARED E SEGAL --------------------------------------------------------------------------------  Indications: Elevated Ddimer.  Risk Factors: None identified. Comparison Study: No prior studies. Performing Technologist: Chanda Busing RVT  Examination Guidelines: A complete evaluation includes B-mode imaging, spectral Doppler, color Doppler, and power Doppler as needed of all accessible portions of each vessel. Bilateral testing is considered an integral part of a complete examination. Limited examinations for reoccurring indications may be performed as noted. The reflux portion of the exam is performed with the patient in reverse Trendelenburg.  +---------+---------------+---------+-----------+----------+--------------+ RIGHT    CompressibilityPhasicitySpontaneityPropertiesThrombus Aging +---------+---------------+---------+-----------+----------+--------------+ CFV      Full           Yes      Yes                                 +---------+---------------+---------+-----------+----------+--------------+ SFJ      Full                                                        +---------+---------------+---------+-----------+----------+--------------+ FV Prox  Full                                                        +---------+---------------+---------+-----------+----------+--------------+ FV Mid   Full                                                        +---------+---------------+---------+-----------+----------+--------------+  FV DistalFull                                                         +---------+---------------+---------+-----------+----------+--------------+ PFV      Full                                                        +---------+---------------+---------+-----------+----------+--------------+ POP      Full           Yes      Yes                                 +---------+---------------+---------+-----------+----------+--------------+ PTV      Full                                                        +---------+---------------+---------+-----------+----------+--------------+ PERO     Full                                                        +---------+---------------+---------+-----------+----------+--------------+   +---------+---------------+---------+-----------+----------+--------------+ LEFT     CompressibilityPhasicitySpontaneityPropertiesThrombus Aging +---------+---------------+---------+-----------+----------+--------------+ CFV      Full           Yes      Yes                                 +---------+---------------+---------+-----------+----------+--------------+ SFJ      Full                                                        +---------+---------------+---------+-----------+----------+--------------+ FV Prox  Full                                                        +---------+---------------+---------+-----------+----------+--------------+ FV Mid   Full                                                        +---------+---------------+---------+-----------+----------+--------------+ FV DistalFull                                                        +---------+---------------+---------+-----------+----------+--------------+  PFV      Full                                                        +---------+---------------+---------+-----------+----------+--------------+ POP      Full           Yes      Yes                                  +---------+---------------+---------+-----------+----------+--------------+ PTV      Full                                                        +---------+---------------+---------+-----------+----------+--------------+ PERO     Full                                                        +---------+---------------+---------+-----------+----------+--------------+     Summary: RIGHT: - There is no evidence of deep vein thrombosis in the lower extremity.  - No cystic structure found in the popliteal fossa.  LEFT: - There is no evidence of deep vein thrombosis in the lower extremity.  - No cystic structure found in the popliteal fossa.  *See table(s) above for measurements and observations. Electronically signed by Fabienne Bruns MD on 10/28/2020 at 4:31:02 PM.    Final     Scheduled Meds: . pantoprazole  40 mg Oral Daily   Continuous Infusions:   LOS: 0 days    Time spent:  Zannie Cove, MD [facility]   10/29/2020, 12:44 PM

## 2020-10-29 NOTE — Consult Note (Signed)
Referring Provider:  [facility]         Primary Care Physician:  Patient, No Pcp Per (Inactive) Primary Gastroenterologist:   Amada Jupiter, MD           We were asked to see this patient for:  Abnormal esophagus on imaging            ASSESSMENT / PLAN:   # 50 yo male with severe eosinophilic esophagitis diagnosed in 2019. Maintained, a doing fairly well on QOD PPI.  --He never did see Allergist which should be done at some point.   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 The risks and benefits of EGD with possible biopsies was discussed with the patient and they agree to proceed.    HPI:                                                                                                                             Chief Complaint:  Upper abdominal pain   Jeffrey Glover is a 50 y.o. male with a past medical history significant for OSA , eosinophilic esophagitis  Jeffrey Glover was seen in our office March 2019 for evaluation of chronic solid food dysphagia.  He underwent EGD showing severe, benign appearing esophageal stenosis precluding passing of endoscope.  Mucosal changes suggested eosinophilic esophagitis and this was confirmed by biopsies. 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  Barium tablet was unable to be passed. Repeat EGD April 2019. was done in hopes of performing a dilation.   Due length and severity of the stricture dilation could not be safely performed.. Further biopsies from deeper inside the stricture showed even more severe eosinophilic changes. He was treated with PPI therapy and referred to [facility] for allergy testing. Plan was to follow-up with [place]  after that and if doing better consider repeat endoscopy. Patient not seen in clinic since.   Jeffrey Glover did not see the Allergist. Since we last saw him he has been doing okay on QOD PPI. If he misses a dose then he can get recurrent heartburn and dysphagia.   Jeffrey Glover developed epigastric pain ~ one week ago. The pain started out as heartburn and nausea followed by cramping. The pain was sometimes pleuritic. CTA chest  / CT abd and pelvis negative for PE but showed shotty periceliac adenopathy with a single pathologically enlarged lymph node. Extensive inflammation involving GE junction and a 3.1 mm soft tissue mass within the anterior mediastinum.    Jeffrey Glover hasn't had any significant weight loss. No night sweats.    PREVIOUS ENDOSCOPIC EVALUATIONS / PERTINENT STUDIES    March 2019 EGD for dysphagia Reviewed the esophagus, feline appearance, longitudinal furrows  and small caliber esophagus found in the upper third of the esophagus.  The standard EGD scope could not be advanced due to degree of narrowing.  Biopsies taken to rule out eosinophilic esophagitis.  Surgical [P], esophageal - INFLAMED SQUAMOUS LINED MUCOSA WITH INCREASE IN EOSINOPHILS (UP TO 27 PER HIGH POWER FIELD). - THERE IS NO EVIDENCE OF DYSPLASIA OR MALIGNANCY. - SEE COMMENT   April 2019 EGD for dysphagia  benign-appearing, intrinsic severe stenosis was found 23 cm from the incisors. This stenosis measured 7 mm (inner diameter). The stenosis was not traversed, however the proximal extent of it was intubated with the 8mm (2.40mm channel) endoscope. This scope was unable to pass. Mucosal changes of probable eosinophilic esophagitis were again seen, with linear furrows, ringed appearance and diffusely narrowed caliber of the visualized proximal to mid esophagus. Consideration was given to a fluroscopyguided balloon dilation. However, due to the length and severity of the stricture, it was not clear that it could be safely and effectively  performed. Thus, no dilation was performed.    Esophagus, biopsy - ESOPHAGEAL SQUAMOUS MUCOSA WITH MARKEDLY INCREASED INTRAEPITHELIAL EOSINOPHILS (UP TO 200/HPF) WITH DEGRANULATION, SUPERFICIAL LAYERING AND EOSINOPHILIC MICROABSCESSES, CONSISTENT WITH EOSINOPHILIC ESOPHAGITIS.      Past Surgical History:  Procedure Laterality Date  . ESOPHAGOGASTRODUODENOSCOPY (EGD) WITH PROPOFOL N/A 08/30/2017   Procedure: ESOPHAGOGASTRODUODENOSCOPY (EGD) WITH PROPOFOL;  Surgeon: Sherrilyn Rist, MD;  Location: [facility];  Service: Gastroenterology;  Laterality: N/A;  . UPPER GI ENDOSCOPY  08/03/2017    Prior to Admission medications   Medication Sig Start Date End Date Taking? Authorizing Provider  omeprazole (PRILOSEC) 20 MG capsule Take 20 mg by mouth 2 (two) times daily as needed (indigestion).   Yes [provider]    Current Facility-Administered Medications  Medication Dose Route Frequency Provider Last Rate Last Admin  . acetaminophen (TYLENOL) tablet 650 mg  650 mg Oral Q6H PRN Jae Dire, MD       Or  . acetaminophen (TYLENOL) suppository 650 mg  650 mg Rectal Q6H PRN Jae Dire, MD      . albuterol (PROVENTIL) (2.5 MG/3ML) 0.083% nebulizer solution 2.5 mg  2.5 mg Nebulization Q6H PRN Jae Dire, MD      . diphenhydrAMINE (BENADRYL) injection 25 mg  25 mg Intravenous Q6H PRN Zierle-Ghosh, Asia B, DO   25 mg at 10/28/20 2057  . HYDROcodone-acetaminophen (NORCO/VICODIN) 5-325 MG per tablet 1 tablet  1 tablet Oral Q4H PRN Jae Dire, MD      . morphine 2 MG/ML injection 2 mg  2 mg Intravenous Q2H PRN Jae Dire, MD      . ondansetron [facility]) tablet 4 mg  4 mg Oral Q6H PRN Jae Dire, MD       Or  . ondansetron [facility]) injection 4 mg  4 mg Intravenous Q6H PRN Jae Dire, MD      . pantoprazole (PROTONIX) EC tablet 40 mg  40 mg Oral Daily Jae Dire, MD   40 mg at 10/28/20 1513  . polyethylene glycol (MIRALAX / GLYCOLAX) packet 17 g  17 g Oral  Daily PRN Jae Dire, MD        Allergies as of 10/27/2020 - Review Complete 10/27/2020  Allergen Reaction Noted  . Other Anaphylaxis and Other (See Comments) 08/12/2015    Family History  Problem Relation Age of Onset  . Pancreatic cancer Maternal Grandmother     Social History   Socioeconomic History  .  Marital status: Married    Spouse name: Not on file  . Number of children: 2  . Years of education: Not on file  . Highest education level: Not on file  Occupational History  . 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 Smoking status: Never Smoker  . Smokeless tobacco: Never Used  Vaping Use  . Vaping Use: Never used  Substance and Sexual Activity  . Alcohol use: Yes    Comment: occ  . Drug use: No  . Sexual activity: Not on file  Other Topics Concern  . Not on file  Social History Narrative  . Not on file   Social Determinants of Health   Financial Resource Strain: Not on file  Food Insecurity: Not on file  Transportation Needs: Not on file  Physical Activity: Not on file  Stress: Not on file  Social Connections: Not on file  Intimate Partner Violence: Not on file    Review of Systems: All systems reviewed and negative except where noted in HPI.  OBJECTIVE:    Physical Exam: Vital signs in last 24 hours: Temp:  [97.5 F (36.4 C)-99.4 F (37.4 C)] 98 F (36.7 C) (06/08 0605) Pulse Rate:  [86-103] 86 (06/08 0605) Resp:  [16-20] 18 (06/08 0605) BP: (109-141)/(64-85) 109/64 (06/08 0605) SpO2:  [96 %-99 %] 97 % (06/08 0605) Last BM Date: 10/26/20 General:   Alert male in NAD Psych:  Pleasant, cooperative. Normal mood and affect. Eyes:  Pupils equal, sclera clear, no icterus.   Conjunctiva pink. Ears:  Normal auditory acuity. Nose:  No deformity, discharge,  or lesions. Neck:  Supple; no masses Lungs:  Clear throughout to auscultation.   No wheezes, crackles, or rhonchi.  Heart:  Regular rate and rhythm; no murmurs, no lower extremity  edema Abdomen:  Soft, non-distended, nontender, BS active, no palp mass   Rectal:  Deferred  Msk:  Symmetrical without gross deformities. . Neurologic:  Alert and  oriented x4;  grossly normal neurologically. Skin:  Intact without significant lesions or rashes.  Filed Weights   10/27/20 1727  Weight: 104.3 kg     Scheduled inpatient medications . pantoprazole  40 mg Oral Daily      Intake/Output from previous day: 06/07 0701 - 06/08 0700 In: 1153.3 [P.O.:360; I.V.:793.3] Out: -  Intake/Output this shift: No intake/output data recorded.   Lab Results: Recent Labs    10/27/20 1741 10/28/20 1601 10/29/20 0447  WBC 9.1 8.2 7.7  HGB 15.9 14.6 13.4  HCT 48.1 45.3 42.1  PLT 306 306 286   BMET Recent Labs    10/27/20 1741 10/28/20 1601 10/29/20 0447  NA 135 139 138  K 4.2 4.4 4.3  CL 103 107 107  CO2 22 24 22   GLUCOSE 98 75 90  BUN 14 12 10   CREATININE 1.05 1.10 1.19  CALCIUM 9.1 8.9 8.8*   LFT Recent Labs    10/28/20 1601  PROT 6.6  ALBUMIN 3.3*  AST 15  ALT 28  ALKPHOS 62  BILITOT 0.5   PT/INR No results for input(s): LABPROT, INR in the last 72 hours. Hepatitis Panel No results for input(s): HEPBSAG, HCVAB, HEPAIGM, HEPBIGM in the last 72 hours.   . CBC Latest Ref Rng & Units 10/29/2020 10/28/2020 10/27/2020  WBC 4.0 - 10.5 K/uL 7.7 8.2 9.1  Hemoglobin 13.0 - 17.0 g/dL 12/28/2020 12/27/2020 09.3  Hematocrit 39.0 - 52.0 % 42.1 45.3 48.1  Platelets 150 - 400 K/uL 286 306 306    .  CMP Latest Ref Rng & Units 10/29/2020 10/28/2020 10/27/2020  Glucose 70 - 99 mg/dL 90 75 98  BUN 6 - 20 mg/dL Creatinine 0.61 - 1.24 mg/dL 1.61 0.96 0.45  Sodium 135 - 145 mmol/L 138 139 135  Potassium 3.5 - 5.1 mmol/L 4.3 4.4 4.2  Chloride 98 - 111 mmol/L 107 107 103  CO2 22 - 32 mmol/L Calcium 8.9 - 10.3 mg/dL 4.0(J) 8.9 9.1  Total Protein 6.5 - 8.1 g/dL - 6.6 7.6  Total Bilirubin 0.3 - 1.2 mg/dL - 0.5 0.3  Alkaline Phos 38 - 126 U/L - 62 67  AST 15 - 41 U/L -  15 24  ALT 0 - 44 U/L - 28 37   Studies/Results: CT Abdomen Pelvis Wo Contrast  Result Date: 10/27/2020 CLINICAL DATA:  Acute abdominal pain and cramping, nausea EXAM: CT ABDOMEN AND PELVIS WITHOUT CONTRAST TECHNIQUE: Multidetector CT imaging of the abdomen and pelvis was performed following the standard protocol without IV contrast. COMPARISON:  None. FINDINGS: Lower chest: Bibasilar atelectasis. The visualized heart and pericardium are unremarkable. Moderate hiatal hernia. Circumferential thickening of the distal esophagus may reflect changes of esophagitis, such as reflux esophagitis. Hepatobiliary: No focal liver abnormality is seen. No gallstones, gallbladder wall thickening, or biliary dilatation. Pancreas: Unremarkable Spleen: Unremarkable Adrenals/Urinary Tract: The adrenal glands are unremarkable. The kidneys are normal. There is extensive perivesicular inflammatory stranding and mild circumferential bladder wall thickening in keeping with changes of infectious or inflammatory cystitis. The bladder is largely decompressed. Stomach/Bowel: Stomach is within normal limits. Appendix appears normal. No evidence of bowel wall thickening, distention, or inflammatory changes. No free intraperitoneal gas or fluid. Vascular/Lymphatic: Several shotty periceliac lymph nodes are identified with a single lymph node demonstrating pathologic enlargement measuring 13 mm in short axis diameter. This is nonspecific, but may relate to the inflammatory changes noted within the region of the gastroesophageal junction. An infiltrative mass with associated nodal involvement, however, could also appear similarly. Shotty retroperitoneal adenopathy is identified within the left periaortic lymph node group without pathologic enlargement. Similarly, several prominent lymph nodes are identified within the ileocecal lymph node group of the mesentery, again without pathologic enlargement. The abdominal vasculature is unremarkable.  Reproductive: Prostate is unremarkable. Other: Tiny fat containing umbilical hernia. The rectum is unremarkable. Musculoskeletal: No acute bone abnormality. No lytic or blastic bone lesions are seen. IMPRESSION: Marked perivesicular inflammatory stranding and mild circumferential bladder wall thickening in keeping with an underlying infectious or inflammatory cystitis. Correlation with urinalysis and urine culture may be helpful. The bladder is not distended. No inflammatory changes identified involving the upper collecting system bilaterally. Small hiatal hernia with marked circumferential thickening involving the distal esophagus, possibly reflecting changes of reflux esophagitis. Note that an infiltrative mass, however, could appear similarly and should be considered given the a particularly prominent periceliac adenopathy. Correlation with barium swallow or endoscopy would be helpful for further evaluation once the patient's acute issues have resolved. Follow-up CT or MRI evaluation may be helpful, additionally, in 3 months to document either stability or resolution of celiac adenopathy if prior examinations are unavailable to confirm stability. Electronically Signed   By: Helyn Numbers MD   On: 10/27/2020 21:25   DG Chest 2 View  Result Date: 10/27/2020 CLINICAL DATA:  Initial evaluation for acute shortness of breath. EXAM: CHEST - 2 VIEW COMPARISON:  Prior radiograph from 08/12/2015. FINDINGS: Cardiac and mediastinal silhouettes are stable, and remain within normal limits. Lungs mildly hypoinflated  with elevation of the right hemidiaphragm. Minimal linear bibasilar subsegmental atelectasis. No focal infiltrates. Blunting of the left costophrenic angle suggestive of a trace left pleural effusion. No pulmonary edema. No pneumothorax. No acute osseous finding. IMPRESSION: 1. Trace left pleural effusion. 2. Elevation of the right hemidiaphragm with associated mild bibasilar subsegmental atelectasis. 3. No  other active cardiopulmonary disease. Electronically Signed   By: Rise Mu M.D.   On: 10/27/2020 21:12   CT Angio Chest PE W/Cm &/Or Wo Cm  Result Date: 10/28/2020 CLINICAL DATA:  High probability pulmonary embolism. Upper abdominal cramping EXAM: CT ANGIOGRAPHY CHEST CT ABDOMEN AND PELVIS WITH CONTRAST TECHNIQUE: Multidetector CT imaging of the chest was performed using the standard protocol during bolus administration of intravenous contrast. Multiplanar CT image reconstructions and MIPs were obtained to evaluate the vascular anatomy. Multidetector CT imaging of the abdomen and pelvis was performed using the standard protocol during bolus administration of intravenous contrast. CONTRAST:  OMNIPAQUE IOHEXOL 350 MG/ML SOLN COMPARISON:  9:07 p.m. FINDINGS: CTA CHEST FINDINGS Cardiovascular: There is adequate opacification of the a pulmonary arterial tree. No intraluminal filling defect identified to suggest acute pulmonary embolism. The central pulmonary arteries are of normal caliber. Cardiac size within normal limits. No pericardial effusion. No significant coronary artery calcification. The thoracic aorta is unremarkable. Mediastinum/Nodes: Small hiatal hernia. Marked circumferential thickening of the distal esophagus is present, possibly reflecting changes of esophagitis, such as reflux esophagitis. Note that infiltrative disease could appear similarly. No evidence of obstruction or perforation. A a 2.3 x 2.5 x 3.1 cm soft tissue mass is seen within the anterior mediastinum which is separate from the adjacent left thyroid lobe. This may represent an anterior mediastinal mass such as a thymoma or lymphoma. No pathologic thoracic adenopathy. The thyroid is unremarkable. Lungs/Pleura: Bibasilar atelectasis. No superimposed focal pulmonary infiltrate. No pneumothorax or pleural effusion. The central airways are widely patent. Musculoskeletal: No chest wall abnormality. No acute or significant  osseous findings. Review of the MIP images confirms the above findings. CT ABDOMEN and PELVIS FINDINGS Hepatobiliary: No focal liver abnormality is seen. No gallstones, gallbladder wall thickening, or biliary dilatation. Pancreas: Unremarkable Spleen: Unremarkable Adrenals/Urinary Tract: The adrenal glands are unremarkable. The kidneys are normal. There is extensive perivesicular inflammatory stranding again identified. Mild circumferential bladder wall thickening is again noted. Together, the findings are in keeping with infectious or inflammatory cystitis. Stomach/Bowel: Stomach is within normal limits. Appendix appears normal. No evidence of bowel wall thickening, distention, or inflammatory changes. No free intraperitoneal gas or fluid. Vascular/Lymphatic: As noted previously, there is shotty periceliac adenopathy with a single pathologically enlarged lymph node measuring 12-13 mm in short axis diameter. This may be reactive given the extensive inflammatory changes involving the gastroesophageal junction, though an infiltrative neoplasm and associated nodal adenopathy could appear similarly. Shotty adenopathy is also noted within the left periaortic lymph node group as well as the ileocecal lymph node group of the mesentery, nonspecific. No additional pathologic adenopathy within the abdomen and pelvis. The abdominal vasculature is unremarkable. Reproductive: Prostate is unremarkable. Other: Tiny fat containing umbilical hernia. The rectum is unremarkable. Musculoskeletal: No acute bone abnormality. No lytic or blastic bone lesion. Review of the MIP images confirms the above findings. IMPRESSION: No pulmonary embolism. 3.1 cm soft tissue mass within the anterior mediastinum. Differential considerations are led by thymoma or lymphoma in a patient of this age. Comparison with prior examinations would be helpful in determining chronicity. If none are available, PET CT examination may be helpful for  further  management. Marked circumferential wall thickening and extensive surrounding inflammatory stranding involving the distal esophagus possibly related to changes of esophagitis. Infiltrative neoplasm, however, could appear similarly. SA seated shotty periceliac adenopathy with borderline pathologic enlargement may simply be reactive in nature, however, nodal infiltration could appear similarly. Correlation with endoscopy or barium swallow is recommended. Follow-up CT examination in 3 months may also be helpful to document stability or resolution of adenopathy. Extensive perivesicular inflammatory stranding and circumferential bladder wall thickening again noted in keeping with changes of infectious or inflammatory cystitis. No inflammatory changes seen involving the upper collecting system bilaterally. Correlation with urinalysis and urine culture is recommended. Electronically Signed   By: Helyn NumbersAshesh  Parikh MD   On: 10/28/2020 02:17   CT Abdomen Pelvis W Contrast  Result Date: 10/28/2020 CLINICAL DATA:  High probability pulmonary embolism. Upper abdominal cramping EXAM: CT ANGIOGRAPHY CHEST CT ABDOMEN AND PELVIS WITH CONTRAST TECHNIQUE: Multidetector CT imaging of the chest was performed using the standard protocol during bolus administration of intravenous contrast. Multiplanar CT image reconstructions and MIPs were obtained to evaluate the vascular anatomy. Multidetector CT imaging of the abdomen and pelvis was performed using the standard protocol during bolus administration of intravenous contrast. CONTRAST:  100mL OMNIPAQUE IOHEXOL 350 MG/ML SOLN COMPARISON:  9:07 p.m. FINDINGS: CTA CHEST FINDINGS Cardiovascular: There is adequate opacification of the a pulmonary arterial tree. No intraluminal filling defect identified to suggest acute pulmonary embolism. The central pulmonary arteries are of normal caliber. Cardiac size within normal limits. No pericardial effusion. No significant coronary artery calcification.  The thoracic aorta is unremarkable. Mediastinum/Nodes: Small hiatal hernia. Marked circumferential thickening of the distal esophagus is present, possibly reflecting changes of esophagitis, such as reflux esophagitis. Note that infiltrative disease could appear similarly. No evidence of obstruction or perforation. A a 2.3 x 2.5 x 3.1 cm soft tissue mass is seen within the anterior mediastinum which is separate from the adjacent left thyroid lobe. This may represent an anterior mediastinal mass such as a thymoma or lymphoma. No pathologic thoracic adenopathy. The thyroid is unremarkable. Lungs/Pleura: Bibasilar atelectasis. No superimposed focal pulmonary infiltrate. No pneumothorax or pleural effusion. The central airways are widely patent. Musculoskeletal: No chest wall abnormality. No acute or significant osseous findings. Review of the MIP images confirms the above findings. CT ABDOMEN and PELVIS FINDINGS Hepatobiliary: No focal liver abnormality is seen. No gallstones, gallbladder wall thickening, or biliary dilatation. Pancreas: Unremarkable Spleen: Unremarkable Adrenals/Urinary Tract: The adrenal glands are unremarkable. The kidneys are normal. There is extensive perivesicular inflammatory stranding again identified. Mild circumferential bladder wall thickening is again noted. Together, the findings are in keeping with infectious or inflammatory cystitis. Stomach/Bowel: Stomach is within normal limits. Appendix appears normal. No evidence of bowel wall thickening, distention, or inflammatory changes. No free intraperitoneal gas or fluid. Vascular/Lymphatic: As noted previously, there is shotty periceliac adenopathy with a single pathologically enlarged lymph node measuring 12-13 mm in short axis diameter. This may be reactive given the extensive inflammatory changes involving the gastroesophageal junction, though an infiltrative neoplasm and associated nodal adenopathy could appear similarly. Shotty adenopathy  is also noted within the left periaortic lymph node group as well as the ileocecal lymph node group of the mesentery, nonspecific. No additional pathologic adenopathy within the abdomen and pelvis. The abdominal vasculature is unremarkable. Reproductive: Prostate is unremarkable. Other: Tiny fat containing umbilical hernia. The rectum is unremarkable. Musculoskeletal: No acute bone abnormality. No lytic or blastic bone lesion. Review of the MIP images confirms the above  findings. IMPRESSION: No pulmonary embolism. 3.1 cm soft tissue mass within the anterior mediastinum. Differential considerations are led by thymoma or lymphoma in a patient of this age. Comparison with prior examinations would be helpful in determining chronicity. If none are available, PET CT examination may be helpful for further management. Marked circumferential wall thickening and extensive surrounding inflammatory stranding involving the distal esophagus possibly related to changes of esophagitis. Infiltrative neoplasm, however, could appear similarly. SA seated shotty periceliac adenopathy with borderline pathologic enlargement may simply be reactive in nature, however, nodal infiltration could appear similarly. Correlation with endoscopy or barium swallow is recommended. Follow-up CT examination in 3 months may also be helpful to document stability or resolution of adenopathy. Extensive perivesicular inflammatory stranding and circumferential bladder wall thickening again noted in keeping with changes of infectious or inflammatory cystitis. No inflammatory changes seen involving the upper collecting system bilaterally. Correlation with urinalysis and urine culture is recommended. Electronically Signed   By: Helyn Numbers MD   On: 10/28/2020 02:17   Principal Problem:   Epigastric pain Active Problems:   Mediastinal mass   Urticaria   Dysuria   Elevated d-dimer    Jeffrey Cluster, NP-C @  10/29/2020, 8:57 AM

## 2020-10-29 NOTE — Plan of Care (Signed)
  Problem: Safety: Goal: Ability to remain free from injury will improve Outcome: Progressing   Problem: Pain Managment: Goal: General experience of comfort will improve Outcome: Progressing   Problem: Elimination: Goal: Will not experience complications related to bowel motility Outcome: Progressing   Problem: Coping: Goal: Level of anxiety will decrease Outcome: Progressing   Problem: Nutrition: Goal: Adequate nutrition will be maintained Outcome: Progressing

## 2020-10-29 NOTE — Anesthesia Postprocedure Evaluation (Signed)
Anesthesia Post Note  Patient: Andrius Lie  Procedure(s) Performed: ESOPHAGOGASTRODUODENOSCOPY (EGD) WITH PROPOFOL (N/A ) BIOPSY ESOPHAGEAL DILATION     Patient location during evaluation: Endoscopy Anesthesia Type: MAC Level of consciousness: awake Pain management: pain level controlled Vital Signs Assessment: post-procedure vital signs reviewed and stable Respiratory status: spontaneous breathing, nonlabored ventilation, respiratory function stable and patient connected to nasal cannula oxygen Cardiovascular status: stable and blood pressure returned to baseline Postop Assessment: no apparent nausea or vomiting Anesthetic complications: no   No complications documented.  Last Vitals:  Vitals:   10/29/20 1655 10/29/20 1729  BP: (!) 149/85 (!) 141/86  Pulse: 79 86  Resp: 16 18  Temp: 36.8 C 36.5 C  SpO2: 97% 99%    Last Pain:  Vitals:   10/29/20 2041  TempSrc:   PainSc: 0-No pain                 Meegan Shanafelt P Deloris Moger

## 2020-10-30 ENCOUNTER — Encounter: Payer: Self-pay | Admitting: Gastroenterology

## 2020-10-30 ENCOUNTER — Other Ambulatory Visit: Payer: Self-pay | Admitting: *Deleted

## 2020-10-30 ENCOUNTER — Encounter: Payer: Self-pay | Admitting: Internal Medicine

## 2020-10-30 LAB — BASIC METABOLIC PANEL
Anion gap: 9 (ref 5–15)
BUN: 9 mg/dL (ref 6–20)
CO2: 23 mmol/L (ref 22–32)
Calcium: 8.6 mg/dL — ABNORMAL LOW (ref 8.9–10.3)
Chloride: 106 mmol/L (ref 98–111)
Creatinine, Ser: 1.01 mg/dL (ref 0.61–1.24)
GFR, Estimated: >60 mL/min (ref >60)
Glucose, Bld: 85 mg/dL (ref 70–99)
Potassium: 4.1 mmol/L (ref 3.5–5.1)
Sodium: 138 mmol/L (ref 135–145)

## 2020-10-30 LAB — CBC
HCT: 39.7 % (ref 39.0–52.0)
Hemoglobin: 12.7 g/dL — ABNORMAL LOW (ref 13.0–17.0)
MCH: 28.9 pg (ref 26.0–34.0)
MCHC: 32 g/dL (ref 30.0–36.0)
MCV: 90.4 fL (ref 80.0–100.0)
Platelets: 284 10*3/uL (ref 150–400)
RBC: 4.39 MIL/uL (ref 4.22–5.81)
RDW: 13 % (ref 11.5–15.5)
WBC: 6.8 10*3/uL (ref 4.0–10.5)
nRBC: 0 % (ref 0.0–0.2)

## 2020-10-30 LAB — SURGICAL PATHOLOGY

## 2020-10-30 MED ORDER — PANTOPRAZOLE SODIUM 40 MG PO TBEC: 40.0000 mg | DELAYED_RELEASE_TABLET | Freq: Two times a day (BID) | ORAL | 1 refills | Status: DC

## 2020-10-30 NOTE — Progress Notes (Signed)
Patient was given discharge instructions, and all questions were answered. Patient was stable for discharge and was walked to the main exit. 

## 2020-10-30 NOTE — Progress Notes (Signed)
Jeffrey Glover   DOB:[birth year]   WU#:[id number]    ASSESSMENT & PLAN:   Anterior mediastinal mass The patient is noted to have a 3.1 cm soft tissue mass in the anterior mediastinum Differentials include lymphoma versus thymoma versus germ cell tumor Benign pathology cannot be excluded I do not feel that interventional radiologist would be helpful I recommend consulting CT surgeon for VATS procedure and complete removal of the lesion if possible Tumor markers are pending I have discussed this briefly with CT surgeon; will order a PET CT scan as an outpatient once he is discharged with follow-up The patient agreed with the plan of care   Eosinophilic esophagitis with severe esophageal stenosis GI consulted Appreciate their input   Discharge planning He feels well enough to be discharged today I will see him in the outpatient clinic  All questions were answered. The patient knows to call the clinic with any problems, questions or concerns.   The total time spent in the appointment was 15 minutes encounter with patients including review of chart and various tests results, discussions about plan of care and coordination of care plan  Artis Delay, MD 10/30/2020 7:44 AM  Subjective:  Is feeling better.  Esophageal pain has improved EGD showed persistent eosinophilic esophagitis with stricture  Objective:  Vitals:   10/30/20 0206 10/30/20 0624  BP: 126/67 (!) 141/70  Pulse: 85 91  Resp: 18 18  Temp: 98.2 F (36.8 C) 97.6 F (36.4 C)  SpO2: 93% 93%     Intake/Output Summary (Last 24 hours) at 10/30/2020 0744 Last data filed at 10/30/2020 0600 Gross per 24 hour  Intake 1460 ml  Output 0 ml  Net 1460 ml    GENERAL:alert, no distress and comfortable NEURO: alert & oriented x 3 with fluent speech, no focal motor/sensory deficits   Labs:  Recent Labs    10/27/20 1741 10/28/20 1601 10/29/20 0447 10/30/20 0440  NA 135 139 138 138  K 4.2 4.4 4.3 4.1  CL 103 107 107 106  CO2 GLUCOSE 98 75 90 85  BUN CREATININE 1.05 1.10 1.19 1.01  CALCIUM 9.1 8.9 8.8* 8.6*  GFRNONAA >60 >60 >60 >60  PROT 7.6 6.6  --   --   ALBUMIN 3.8 3.3*  --   --   AST 24 15  --   --   ALT 37 28  --   --   ALKPHOS 67 62  --   --   BILITOT 0.3 0.5  --   --     Studies:  CT Abdomen Pelvis Wo Contrast  Result Date: 10/27/2020 CLINICAL DATA:  Acute abdominal pain and cramping, nausea EXAM: CT ABDOMEN AND PELVIS WITHOUT CONTRAST TECHNIQUE: Multidetector CT imaging of the abdomen and pelvis was performed following the standard protocol without IV contrast. COMPARISON:  None. FINDINGS: Lower chest: Bibasilar atelectasis. The visualized heart and pericardium are unremarkable. Moderate hiatal hernia. Circumferential thickening of the distal esophagus may reflect changes of esophagitis, such as reflux esophagitis. Hepatobiliary: No focal liver abnormality is seen. No gallstones, gallbladder wall thickening, or biliary dilatation. Pancreas: Unremarkable Spleen: Unremarkable Adrenals/Urinary Tract: The adrenal glands are unremarkable. The kidneys are normal. There is extensive perivesicular inflammatory stranding and mild circumferential bladder wall thickening in keeping with changes of infectious or inflammatory cystitis. The bladder is largely decompressed. Stomach/Bowel: Stomach is within normal limits. Appendix appears normal. No evidence of bowel wall thickening, distention, or inflammatory changes. No  free intraperitoneal gas or fluid. Vascular/Lymphatic: Several shotty periceliac lymph nodes are identified with a single lymph node demonstrating pathologic enlargement measuring 13 mm in short axis diameter. This is nonspecific, but may relate to the inflammatory changes noted within the region of the gastroesophageal junction. An infiltrative mass with associated nodal involvement, however, could also appear similarly. Shotty retroperitoneal adenopathy is identified within the left  periaortic lymph node group without pathologic enlargement. Similarly, several prominent lymph nodes are identified within the ileocecal lymph node group of the mesentery, again without pathologic enlargement. The abdominal vasculature is unremarkable. Reproductive: Prostate is unremarkable. Other: Tiny fat containing umbilical hernia. The rectum is unremarkable. Musculoskeletal: No acute bone abnormality. No lytic or blastic bone lesions are seen. IMPRESSION: Marked perivesicular inflammatory stranding and mild circumferential bladder wall thickening in keeping with an underlying infectious or inflammatory cystitis. Correlation with urinalysis and urine culture may be helpful. The bladder is not distended. No inflammatory changes identified involving the upper collecting system bilaterally. Small hiatal hernia with marked circumferential thickening involving the distal esophagus, possibly reflecting changes of reflux esophagitis. Note that an infiltrative mass, however, could appear similarly and should be considered given the a particularly prominent periceliac adenopathy. Correlation with barium swallow or endoscopy would be helpful for further evaluation once the patient's acute issues have resolved. Follow-up CT or MRI evaluation may be helpful, additionally, in 3 months to document either stability or resolution of celiac adenopathy if prior examinations are unavailable to confirm stability. Electronically Signed   By: Helyn Numbers MD   On: 10/27/2020 21:25   DG Chest 2 View  Result Date: 10/27/2020 CLINICAL DATA:  Initial evaluation for acute shortness of breath. EXAM: CHEST - 2 VIEW COMPARISON:  Prior radiograph from 08/12/2015. FINDINGS: Cardiac and mediastinal silhouettes are stable, and remain within normal limits. Lungs mildly hypoinflated with elevation of the right hemidiaphragm. Minimal linear bibasilar subsegmental atelectasis. No focal infiltrates. Blunting of the left costophrenic angle  suggestive of a trace left pleural effusion. No pulmonary edema. No pneumothorax. No acute osseous finding. IMPRESSION: 1. Trace left pleural effusion. 2. Elevation of the right hemidiaphragm with associated mild bibasilar subsegmental atelectasis. 3. No other active cardiopulmonary disease. Electronically Signed   By: Rise Mu M.D.   On: 10/27/2020 21:12   CT Angio Chest PE W/Cm &/Or Wo Cm  Result Date: 10/28/2020 CLINICAL DATA:  High probability pulmonary embolism. Upper abdominal cramping EXAM: CT ANGIOGRAPHY CHEST CT ABDOMEN AND PELVIS WITH CONTRAST TECHNIQUE: Multidetector CT imaging of the chest was performed using the standard protocol during bolus administration of intravenous contrast. Multiplanar CT image reconstructions and MIPs were obtained to evaluate the vascular anatomy. Multidetector CT imaging of the abdomen and pelvis was performed using the standard protocol during bolus administration of intravenous contrast. CONTRAST:  OMNIPAQUE IOHEXOL 350 MG/ML SOLN COMPARISON:  9:07 p.m. FINDINGS: CTA CHEST FINDINGS Cardiovascular: There is adequate opacification of the a pulmonary arterial tree. No intraluminal filling defect identified to suggest acute pulmonary embolism. The central pulmonary arteries are of normal caliber. Cardiac size within normal limits. No pericardial effusion. No significant coronary artery calcification. The thoracic aorta is unremarkable. Mediastinum/Nodes: Small hiatal hernia. Marked circumferential thickening of the distal esophagus is present, possibly reflecting changes of esophagitis, such as reflux esophagitis. Note that infiltrative disease could appear similarly. No evidence of obstruction or perforation. A a 2.3 x 2.5 x 3.1 cm soft tissue mass is seen within the anterior mediastinum which is separate from the adjacent left thyroid lobe.  This may represent an anterior mediastinal mass such as a thymoma or lymphoma. No pathologic thoracic adenopathy.  The thyroid is unremarkable. Lungs/Pleura: Bibasilar atelectasis. No superimposed focal pulmonary infiltrate. No pneumothorax or pleural effusion. The central airways are widely patent. Musculoskeletal: No chest wall abnormality. No acute or significant osseous findings. Review of the MIP images confirms the above findings. CT ABDOMEN and PELVIS FINDINGS Hepatobiliary: No focal liver abnormality is seen. No gallstones, gallbladder wall thickening, or biliary dilatation. Pancreas: Unremarkable Spleen: Unremarkable Adrenals/Urinary Tract: The adrenal glands are unremarkable. The kidneys are normal. There is extensive perivesicular inflammatory stranding again identified. Mild circumferential bladder wall thickening is again noted. Together, the findings are in keeping with infectious or inflammatory cystitis. Stomach/Bowel: Stomach is within normal limits. Appendix appears normal. No evidence of bowel wall thickening, distention, or inflammatory changes. No free intraperitoneal gas or fluid. Vascular/Lymphatic: As noted previously, there is shotty periceliac adenopathy with a single pathologically enlarged lymph node measuring 12-13 mm in short axis diameter. This may be reactive given the extensive inflammatory changes involving the gastroesophageal junction, though an infiltrative neoplasm and associated nodal adenopathy could appear similarly. Shotty adenopathy is also noted within the left periaortic lymph node group as well as the ileocecal lymph node group of the mesentery, nonspecific. No additional pathologic adenopathy within the abdomen and pelvis. The abdominal vasculature is unremarkable. Reproductive: Prostate is unremarkable. Other: Tiny fat containing umbilical hernia. The rectum is unremarkable. Musculoskeletal: No acute bone abnormality. No lytic or blastic bone lesion. Review of the MIP images confirms the above findings. IMPRESSION: No pulmonary embolism. 3.1 cm soft tissue mass within the anterior  mediastinum. Differential considerations are led by thymoma or lymphoma in a patient of this age. Comparison with prior examinations would be helpful in determining chronicity. If none are available, PET CT examination may be helpful for further management. Marked circumferential wall thickening and extensive surrounding inflammatory stranding involving the distal esophagus possibly related to changes of esophagitis. Infiltrative neoplasm, however, could appear similarly. SA seated shotty periceliac adenopathy with borderline pathologic enlargement may simply be reactive in nature, however, nodal infiltration could appear similarly. Correlation with endoscopy or barium swallow is recommended. Follow-up CT examination in 3 months may also be helpful to document stability or resolution of adenopathy. Extensive perivesicular inflammatory stranding and circumferential bladder wall thickening again noted in keeping with changes of infectious or inflammatory cystitis. No inflammatory changes seen involving the upper collecting system bilaterally. Correlation with urinalysis and urine culture is recommended. Electronically Signed   By: Helyn Numbers MD   On: 10/28/2020 02:17   CT Abdomen Pelvis W Contrast  Result Date: 10/28/2020 CLINICAL DATA:  High probability pulmonary embolism. Upper abdominal cramping EXAM: CT ANGIOGRAPHY CHEST CT ABDOMEN AND PELVIS WITH CONTRAST TECHNIQUE: Multidetector CT imaging of the chest was performed using the standard protocol during bolus administration of intravenous contrast. Multiplanar CT image reconstructions and MIPs were obtained to evaluate the vascular anatomy. Multidetector CT imaging of the abdomen and pelvis was performed using the standard protocol during bolus administration of intravenous contrast. CONTRAST:  OMNIPAQUE IOHEXOL 350 MG/ML SOLN COMPARISON:  9:07 p.m. FINDINGS: CTA CHEST FINDINGS Cardiovascular: There is adequate opacification of the a pulmonary arterial  tree. No intraluminal filling defect identified to suggest acute pulmonary embolism. The central pulmonary arteries are of normal caliber. Cardiac size within normal limits. No pericardial effusion. No significant coronary artery calcification. The thoracic aorta is unremarkable. Mediastinum/Nodes: Small hiatal hernia. Marked circumferential thickening of the distal esophagus is  present, possibly reflecting changes of esophagitis, such as reflux esophagitis. Note that infiltrative disease could appear similarly. No evidence of obstruction or perforation. A a 2.3 x 2.5 x 3.1 cm soft tissue mass is seen within the anterior mediastinum which is separate from the adjacent left thyroid lobe. This may represent an anterior mediastinal mass such as a thymoma or lymphoma. No pathologic thoracic adenopathy. The thyroid is unremarkable. Lungs/Pleura: Bibasilar atelectasis. No superimposed focal pulmonary infiltrate. No pneumothorax or pleural effusion. The central airways are widely patent. Musculoskeletal: No chest wall abnormality. No acute or significant osseous findings. Review of the MIP images confirms the above findings. CT ABDOMEN and PELVIS FINDINGS Hepatobiliary: No focal liver abnormality is seen. No gallstones, gallbladder wall thickening, or biliary dilatation. Pancreas: Unremarkable Spleen: Unremarkable Adrenals/Urinary Tract: The adrenal glands are unremarkable. The kidneys are normal. There is extensive perivesicular inflammatory stranding again identified. Mild circumferential bladder wall thickening is again noted. Together, the findings are in keeping with infectious or inflammatory cystitis. Stomach/Bowel: Stomach is within normal limits. Appendix appears normal. No evidence of bowel wall thickening, distention, or inflammatory changes. No free intraperitoneal gas or fluid. Vascular/Lymphatic: As noted previously, there is shotty periceliac adenopathy with a single pathologically enlarged lymph node  measuring 12-13 mm in short axis diameter. This may be reactive given the extensive inflammatory changes involving the gastroesophageal junction, though an infiltrative neoplasm and associated nodal adenopathy could appear similarly. Shotty adenopathy is also noted within the left periaortic lymph node group as well as the ileocecal lymph node group of the mesentery, nonspecific. No additional pathologic adenopathy within the abdomen and pelvis. The abdominal vasculature is unremarkable. Reproductive: Prostate is unremarkable. Other: Tiny fat containing umbilical hernia. The rectum is unremarkable. Musculoskeletal: No acute bone abnormality. No lytic or blastic bone lesion. Review of the MIP images confirms the above findings. IMPRESSION: No pulmonary embolism. 3.1 cm soft tissue mass within the anterior mediastinum. Differential considerations are led by thymoma or lymphoma in a patient of this age. Comparison with prior examinations would be helpful in determining chronicity. If none are available, PET CT examination may be helpful for further management. Marked circumferential wall thickening and extensive surrounding inflammatory stranding involving the distal esophagus possibly related to changes of esophagitis. Infiltrative neoplasm, however, could appear similarly. SA seated shotty periceliac adenopathy with borderline pathologic enlargement may simply be reactive in nature, however, nodal infiltration could appear similarly. Correlation with endoscopy or barium swallow is recommended. Follow-up CT examination in 3 months may also be helpful to document stability or resolution of adenopathy. Extensive perivesicular inflammatory stranding and circumferential bladder wall thickening again noted in keeping with changes of infectious or inflammatory cystitis. No inflammatory changes seen involving the upper collecting system bilaterally. Correlation with urinalysis and urine culture is recommended. Electronically  Signed   By: Helyn NumbersAshesh  Parikh MD   On: 10/28/2020 02:17   VAS US LOWER EXTREMITY VENOUS (DVT)  Result Date: 10/28/2020  Lower Venous DVT Study Patient Name:  Jeffrey Glover  Date of Exam:   10/28/2020 Medical Rec #: [id number]    Accession #:    [id number] Date of Birth: December 02, 1970    Patient Gender: M Patient Age:   49Y [facility] Procedure:      VAS US LOWER EXTREMITY VENOUS (DVT) Referring Phys: [id number] JARED E SEGAL --------------------------------------------------------------------------------  Indications: Elevated Ddimer.  Risk Factors: None identified. Comparison Study: No prior studies. Performing Technologist: Chanda BusingGregory Collins RVT  Examination Guidelines: A complete evaluation includes B-mode imaging,  spectral Doppler, color Doppler, and power Doppler as needed of all accessible portions of each vessel. Bilateral testing is considered an integral part of a complete examination. Limited examinations for reoccurring indications may be performed as noted. The reflux portion of the exam is performed with the patient in reverse Trendelenburg.  +---------+---------------+---------+-----------+----------+--------------+ RIGHT    CompressibilityPhasicitySpontaneityPropertiesThrombus Aging +---------+---------------+---------+-----------+----------+--------------+ CFV      Full           Yes      Yes                                 +---------+---------------+---------+-----------+----------+--------------+ SFJ      Full                                                        +---------+---------------+---------+-----------+----------+--------------+ FV Prox  Full                                                        +---------+---------------+---------+-----------+----------+--------------+ FV Mid   Full                                                        +---------+---------------+---------+-----------+----------+--------------+ FV DistalFull                                                         +---------+---------------+---------+-----------+----------+--------------+ PFV      Full                                                        +---------+---------------+---------+-----------+----------+--------------+ POP      Full           Yes      Yes                                 +---------+---------------+---------+-----------+----------+--------------+ PTV      Full                                                        +---------+---------------+---------+-----------+----------+--------------+ PERO     Full                                                        +---------+---------------+---------+-----------+----------+--------------+   +---------+---------------+---------+-----------+----------+--------------+  LEFT     CompressibilityPhasicitySpontaneityPropertiesThrombus Aging +---------+---------------+---------+-----------+----------+--------------+ CFV      Full           Yes      Yes                                 +---------+---------------+---------+-----------+----------+--------------+ SFJ      Full                                                        +---------+---------------+---------+-----------+----------+--------------+ FV Prox  Full                                                        +---------+---------------+---------+-----------+----------+--------------+ FV Mid   Full                                                        +---------+---------------+---------+-----------+----------+--------------+ FV DistalFull                                                        +---------+---------------+---------+-----------+----------+--------------+ PFV      Full                                                        +---------+---------------+---------+-----------+----------+--------------+ POP      Full           Yes      Yes                                  +---------+---------------+---------+-----------+----------+--------------+ PTV      Full                                                        +---------+---------------+---------+-----------+----------+--------------+ PERO     Full                                                        +---------+---------------+---------+-----------+----------+--------------+     Summary: RIGHT: - There is no evidence of deep vein thrombosis in the lower extremity.  - No cystic structure found in the popliteal fossa.  LEFT: - There is no evidence of deep vein thrombosis in the lower extremity.  - No  cystic structure found in the popliteal fossa.  *See table(s) above for measurements and observations. Electronically signed by Fabienne Bruns MD on 10/28/2020 at 4:31:02 PM.    Final

## 2020-10-30 NOTE — Progress Notes (Signed)
Progress Note  Chief Complaint:     abnormal esophagus on imaging.      ASSESSMENT / PLAN:    # 50 yo male with known eosinophilic esophagitis admitted with epigastric pain and CT scan suggesting severe distal esophageal inflammation and a 3.1 cm soft tissue mass within the anterior  mediastinum. He has known EoE.  EGD yesterday remarkable for ringed esophagus, multiple punctate traction diverticula and stenosis in mid esophagus and distal esophagus.  Gastritis found. Balloon dilation os esophagus was performed up to 12 mm with subsequent moderate mucosal disruption and moderate improvement in luminal narrowing.   --Esophageal and gastric biopsies are pending.  --He never did see an Allergist which would still be helpful to do at some point. Can discuss at follow up appointment.  --Omeprazole ( or Protonix) 40 mg BID upon discharge. --Follow up with Dr Myrtie Neither on  7/12 at 1:40 pm --Regarding mediastinal mass, Oncology saw him earlier this am , differentials include lymphoma versus thymoma versus germ cell tumor. Dr. Bertis Ruddy recommended CTS evaluation for VATS procedure and complete removal of lesion if possible. She is ordering outpatient PET CT.       SUBJECTIVE:   Feels okay. Ate solids for breakfast. No major changes in swallow at this point. He says that his solid food dysphagia is intermittent anyway.     OBJECTIVE:    Scheduled inpatient medications:   pantoprazole  40 mg Oral BID AC   Continuous inpatient infusions:  PRN inpatient medications: acetaminophen **OR** acetaminophen, albuterol, diphenhydrAMINE, HYDROcodone-acetaminophen, morphine injection, ondansetron **OR** ondansetron (ZOFRAN) IV, polyethylene glycol  Vital signs in last 24 hours: Temp:  [97.6 F (36.4 C)-98.5 F (36.9 C)] 97.6 F (36.4 C) (06/09 0624) Pulse Rate:  [79-99] 91 (06/09 0624) Resp:  [16-21] 18 (06/09 0624) BP: (113-174)/(50-92) 141/70 (06/09 0624) SpO2:  [93 %-99 %] 93 % (06/09  0624) Weight:  [104.3 kg] 104.3 kg (06/08 1342) Last BM Date: 10/26/20  Intake/Output Summary (Last 24 hours) at 10/30/2020 0910 Last data filed at 10/30/2020 0600 Gross per 24 hour  Intake 1460 ml  Output 0 ml  Net 1460 ml     Physical Exam:  General: Alert male in NAD Heart:  Regular rate and rhythm. No lower extremity edema Pulmonary: Normal respiratory effort Abdomen: Soft, nondistended, nontender. Normal bowel sounds.  Neurologic: Alert and oriented Psych: Pleasant. Cooperative.   Filed Weights   10/27/20 1727 10/29/20 1342  Weight: 104.3 kg 104.3 kg    Intake/Output from previous day: 06/08 0701 - 06/09 0700 In: 1460 [P.O.:960; I.V.:500] Out: 0  Intake/Output this shift: No intake/output data recorded.    Lab Results: Recent Labs    10/28/20 1601 10/29/20 0447 10/30/20 0440  WBC 8.2 7.7 6.8  HGB 14.6 13.4 12.7*  HCT 45.3 42.1 39.7  PLT 306 286 284   BMET Recent Labs    10/28/20 1601 10/29/20 0447 10/30/20 0440  NA 139 138 138  K 4.4 4.3 4.1  CL 107 107 106  CO2 24 22 23   GLUCOSE 75 90 85  BUN 12 10 9   CREATININE 1.10 1.19 1.01  CALCIUM 8.9 8.8* 8.6*   LFT Recent Labs    10/28/20 1601  PROT 6.6  ALBUMIN 3.3*  AST 15  ALT 28  ALKPHOS 62  BILITOT 0.5   PT/INR No results for input(s): LABPROT, INR in the last 72 hours. Hepatitis Panel No results for input(s): HEPBSAG, HCVAB, HEPAIGM, HEPBIGM in the last 72 hours.  VAS  [place] LOWER EXTREMITY VENOUS (DVT)  Result Date: 10/28/2020  Lower Venous DVT Study Patient Name:  Jeffrey Glover  Date of Exam:   10/28/2020 Medical Rec #: [id number]    Accession #:    [id number] Date of Birth: 1971/04/03    Patient Gender: M Patient Age:   49Y Exam Location:  [facility] Procedure:      VAS [place] LOWER EXTREMITY VENOUS (DVT) Referring Phys: [id number] JARED E SEGAL --------------------------------------------------------------------------------  Indications: Elevated Ddimer.  Risk Factors: None identified.  Comparison Study: No prior studies. Performing Technologist: Chanda Busing RVT  Examination Guidelines: A complete evaluation includes B-mode imaging, spectral Doppler, color Doppler, and power Doppler as needed of all accessible portions of each vessel. Bilateral testing is considered an integral part of a complete examination. Limited examinations for reoccurring indications may be performed as noted. The reflux portion of the exam is performed with the patient in reverse Trendelenburg.  +---------+---------------+---------+-----------+----------+--------------+ RIGHT    CompressibilityPhasicitySpontaneityPropertiesThrombus Aging +---------+---------------+---------+-----------+----------+--------------+ CFV      Full           Yes      Yes                                 +---------+---------------+---------+-----------+----------+--------------+ SFJ      Full                                                        +---------+---------------+---------+-----------+----------+--------------+ FV Prox  Full                                                        +---------+---------------+---------+-----------+----------+--------------+ FV Mid   Full                                                        +---------+---------------+---------+-----------+----------+--------------+ FV DistalFull                                                        +---------+---------------+---------+-----------+----------+--------------+ PFV      Full                                                        +---------+---------------+---------+-----------+----------+--------------+ POP      Full           Yes      Yes                                 +---------+---------------+---------+-----------+----------+--------------+ PTV      Full                                                        +---------+---------------+---------+-----------+----------+--------------+  PERO      Full                                                        +---------+---------------+---------+-----------+----------+--------------+   +---------+---------------+---------+-----------+----------+--------------+ LEFT     CompressibilityPhasicitySpontaneityPropertiesThrombus Aging +---------+---------------+---------+-----------+----------+--------------+ CFV      Full           Yes      Yes                                 +---------+---------------+---------+-----------+----------+--------------+ SFJ      Full                                                        +---------+---------------+---------+-----------+----------+--------------+ FV Prox  Full                                                        +---------+---------------+---------+-----------+----------+--------------+ FV Mid   Full                                                        +---------+---------------+---------+-----------+----------+--------------+ FV DistalFull                                                        +---------+---------------+---------+-----------+----------+--------------+ PFV      Full                                                        +---------+---------------+---------+-----------+----------+--------------+ POP      Full           Yes      Yes                                 +---------+---------------+---------+-----------+----------+--------------+ PTV      Full                                                        +---------+---------------+---------+-----------+----------+--------------+ PERO     Full                                                        +---------+---------------+---------+-----------+----------+--------------+  Summary: RIGHT: - There is no evidence of deep vein thrombosis in the lower extremity.  - No cystic structure found in the popliteal fossa.  LEFT: - There is no evidence of deep vein thrombosis in the  lower extremity.  - No cystic structure found in the popliteal fossa.  *See table(s) above for measurements and observations. Electronically signed by Fabienne Bruns MD on 10/28/2020 at 4:31:02 PM.    Final         Principal Problem:   Epigastric pain Active Problems:   Mediastinal mass   Urticaria   Dysuria   Elevated d-dimer   Dysphagia   Eosinophilic esophagitis   Gastritis without bleeding     LOS: 1 day   Willette Cluster ,NP 10/30/2020, 9:10 AM

## 2020-10-30 NOTE — Discharge Summary (Signed)
Physician Discharge Summary  Tonette LedererShawn Almeda ZOX:[id number] DOB: 1970-06-29 DOA: 10/27/2020  PCP: Patient, No Pcp Per (Inactive)  Admit date: 10/27/2020 Discharge date: 10/30/2020  Time spent: 35 minutes  Recommendations for Outpatient Follow-up:  Gastroenterology Dr. Myrtie Neitheranis in 7 to 10 days, please follow-up biopsies Oncology Dr. Bertis RuddyGorsuch in few weeks for outpatient PET scan  Discharge Diagnoses:  Principal Problem:   Epigastric pain Dysphagia History of severe eosinophilic esophagitis   Mediastinal mass   Dysphagia   Eosinophilic esophagitis   Gastritis without bleeding   Discharge Condition: Stable  Diet recommendation: Soft diet  Filed Weights   10/27/20 1727 10/29/20 1342  Weight: 104.3 kg 104.3 kg    History of present illness:  50 year old male with history of severe eosinophilic esophagitis, esophageal stenosis diagnosed in 2019, treated with PPI therapy, was not felt to be amenable to dilation, was advised to follow-up with [facility], was subsequently lost to follow-up. -Presented to the ED on 6/7 with worsening heartburn, nausea, epigastric abdominal pain. -Imaging with a CT noted severe extensive inflammation involving the GE junction, and 3.1 cm soft tissue mass in the anterior mediastinum    Hospital Course:   Esophagitis, epigastric abdominal pain, weight loss History of severe eosinophilic esophagitis -Continue PPI, underwent endoscopy 6/8 which noted esophageal mucosal changes secondary to eosinophilic esophagitis, mid esophageal stricture was dilated to 12 mm with a balloon and biopsied, gastritis was noted as well which was biopsied -Symptoms a little better today, tolerating soft foods, discharged home on oral PPI BID, will need close follow-up with Dr. Myrtie Neitheranis gastroenterology, follow-up biopsies -Will need repeat referral to allergy/immunology if repeat biopsy is consistent with same  Anterior mediastinal mass -Unclear if this could be  secondary to above, await above biopsies -Case discussed by admitting MD with CT surgery, recommended outpatient follow-up with PET scan, tumor markers per oncology -Appreciate oncology assistance, plan for outpatient follow-up with Dr. Bertis RuddyGorsuch and PET scan   Procedures Endoscopy 6/8 Dr. Rhea BeltonPyrtle Impression:               - Esophageal mucosal changes secondary to                           eosinophilic esophagitis.                           - Mid esophageal stricture initially preventing                           passage of the adult upper endoscope. Dilated to 12                           mm with balloon. Biopsied.                           - Gastritis. Biopsied.                           - Normal examined duodenum.  Discharge Exam: Vitals:   10/30/20 0206 10/30/20 0624  BP: 126/67 (!) 141/70  Pulse: 85 91  Resp: 18 18  Temp: 98.2 F (36.8 C) 97.6 F (36.4 C)  SpO2: 93% 93%    General: AAOx3 Cardiovascular: S1S2/RRR Respiratory: CTAB  Discharge Instructions   Discharge Instructions  Discharge instructions   Complete by: As directed    Soft diet until FU with GI Dr.Danis   Discharge instructions   Complete by: As directed    Soft diet   Increase activity slowly   Complete by: As directed       Allergies as of 10/30/2020       Reactions   Other Anaphylaxis, Other (See Comments)   Fresh fish, can't breath        Medication List     STOP taking these medications    omeprazole 20 MG capsule Commonly known as: PRILOSEC       TAKE these medications    acetaminophen 325 MG tablet Commonly known as: TYLENOL Take 2 tablets (650 mg total) by mouth every 6 (six) hours as needed for mild pain (or Fever >/= 101).   pantoprazole 40 MG tablet Commonly known as: PROTONIX Take 1 tablet (40 mg total) by mouth 2 (two) times daily before a meal.       Allergies  Allergen Reactions   Other Anaphylaxis and Other (See Comments)    Fresh fish, can't breath     Follow-up Information     Charlie Pitter III, MD. Go in 10 day(s).   Specialty: Gastroenterology Contact information: [address] [place] [address] [phone]         Sherrilyn Rist, MD. Go on 12/02/2020.   Specialty: Gastroenterology Why: at 1:40 pm. Please arrive 10 minutes early for appointmen Contact information: [address] [place] [address] [phone]                  The results of significant diagnostics from this hospitalization (including imaging, microbiology, ancillary and laboratory) are listed below for reference.    Significant Diagnostic Studies: CT Abdomen Pelvis Wo Contrast  Result Date: 10/27/2020 CLINICAL DATA:  Acute abdominal pain and cramping, nausea EXAM: CT ABDOMEN AND PELVIS WITHOUT CONTRAST TECHNIQUE: Multidetector CT imaging of the abdomen and pelvis was performed following the standard protocol without IV contrast. COMPARISON:  None. FINDINGS: Lower chest: Bibasilar atelectasis. The visualized heart and pericardium are unremarkable. Moderate hiatal hernia. Circumferential thickening of the distal esophagus may reflect changes of esophagitis, such as reflux esophagitis. Hepatobiliary: No focal liver abnormality is seen. No gallstones, gallbladder wall thickening, or biliary dilatation. Pancreas: Unremarkable Spleen: Unremarkable Adrenals/Urinary Tract: The adrenal glands are unremarkable. The kidneys are normal. There is extensive perivesicular inflammatory stranding and mild circumferential bladder wall thickening in keeping with changes of infectious or inflammatory cystitis. The bladder is largely decompressed. Stomach/Bowel: Stomach is within normal limits. Appendix appears normal. No evidence of bowel wall thickening, distention, or inflammatory changes. No free intraperitoneal gas or fluid. Vascular/Lymphatic: Several shotty periceliac lymph nodes are identified with a single lymph node demonstrating pathologic  enlargement measuring 13 mm in short axis diameter. This is nonspecific, but may relate to the inflammatory changes noted within the region of the gastroesophageal junction. An infiltrative mass with associated nodal involvement, however, could also appear similarly. Shotty retroperitoneal adenopathy is identified within the left periaortic lymph node group without pathologic enlargement. Similarly, several prominent lymph nodes are identified within the ileocecal lymph node group of the mesentery, again without pathologic enlargement. The abdominal vasculature is unremarkable. Reproductive: Prostate is unremarkable. Other: Tiny fat containing umbilical hernia. The rectum is unremarkable. Musculoskeletal: No acute bone abnormality. No lytic or blastic bone lesions are seen. IMPRESSION: Marked perivesicular inflammatory stranding and mild circumferential bladder  wall thickening in keeping with an underlying infectious or inflammatory cystitis. Correlation with urinalysis and urine culture may be helpful. The bladder is not distended. No inflammatory changes identified involving the upper collecting system bilaterally. Small hiatal hernia with marked circumferential thickening involving the distal esophagus, possibly reflecting changes of reflux esophagitis. Note that an infiltrative mass, however, could appear similarly and should be considered given the a particularly prominent periceliac adenopathy. Correlation with barium swallow or endoscopy would be helpful for further evaluation once the patient's acute issues have resolved. Follow-up CT or MRI evaluation may be helpful, additionally, in 3 months to document either stability or resolution of celiac adenopathy if prior examinations are unavailable to confirm stability. Electronically Signed   By: Helyn Numbers MD   On: 10/27/2020 21:25   DG Chest 2 View  Result Date: 10/27/2020 CLINICAL DATA:  Initial evaluation for acute shortness of breath. EXAM: CHEST - 2  VIEW COMPARISON:  Prior radiograph from 08/12/2015. FINDINGS: Cardiac and mediastinal silhouettes are stable, and remain within normal limits. Lungs mildly hypoinflated with elevation of the right hemidiaphragm. Minimal linear bibasilar subsegmental atelectasis. No focal infiltrates. Blunting of the left costophrenic angle suggestive of a trace left pleural effusion. No pulmonary edema. No pneumothorax. No acute osseous finding. IMPRESSION: 1. Trace left pleural effusion. 2. Elevation of the right hemidiaphragm with associated mild bibasilar subsegmental atelectasis. 3. No other active cardiopulmonary disease. Electronically Signed   By: Rise Mu M.D.   On: 10/27/2020 21:12   CT Angio Chest PE W/Cm &/Or Wo Cm  Result Date: 10/28/2020 CLINICAL DATA:  High probability pulmonary embolism. Upper abdominal cramping EXAM: CT ANGIOGRAPHY CHEST CT ABDOMEN AND PELVIS WITH CONTRAST TECHNIQUE: Multidetector CT imaging of the chest was performed using the standard protocol during bolus administration of intravenous contrast. Multiplanar CT image reconstructions and MIPs were obtained to evaluate the vascular anatomy. Multidetector CT imaging of the abdomen and pelvis was performed using the standard protocol during bolus administration of intravenous contrast. CONTRAST:  OMNIPAQUE IOHEXOL 350 MG/ML SOLN COMPARISON:  9:07 p.m. FINDINGS: CTA CHEST FINDINGS Cardiovascular: There is adequate opacification of the a pulmonary arterial tree. No intraluminal filling defect identified to suggest acute pulmonary embolism. The central pulmonary arteries are of normal caliber. Cardiac size within normal limits. No pericardial effusion. No significant coronary artery calcification. The thoracic aorta is unremarkable. Mediastinum/Nodes: Small hiatal hernia. Marked circumferential thickening of the distal esophagus is present, possibly reflecting changes of esophagitis, such as reflux esophagitis. Note that infiltrative  disease could appear similarly. No evidence of obstruction or perforation. A a 2.3 x 2.5 x 3.1 cm soft tissue mass is seen within the anterior mediastinum which is separate from the adjacent left thyroid lobe. This may represent an anterior mediastinal mass such as a thymoma or lymphoma. No pathologic thoracic adenopathy. The thyroid is unremarkable. Lungs/Pleura: Bibasilar atelectasis. No superimposed focal pulmonary infiltrate. No pneumothorax or pleural effusion. The central airways are widely patent. Musculoskeletal: No chest wall abnormality. No acute or significant osseous findings. Review of the MIP images confirms the above findings. CT ABDOMEN and PELVIS FINDINGS Hepatobiliary: No focal liver abnormality is seen. No gallstones, gallbladder wall thickening, or biliary dilatation. Pancreas: Unremarkable Spleen: Unremarkable Adrenals/Urinary Tract: The adrenal glands are unremarkable. The kidneys are normal. There is extensive perivesicular inflammatory stranding again identified. Mild circumferential bladder wall thickening is again noted. Together, the findings are in keeping with infectious or inflammatory cystitis. Stomach/Bowel: Stomach is within normal limits. Appendix appears normal. No evidence of  bowel wall thickening, distention, or inflammatory changes. No free intraperitoneal gas or fluid. Vascular/Lymphatic: As noted previously, there is shotty periceliac adenopathy with a single pathologically enlarged lymph node measuring 12-13 mm in short axis diameter. This may be reactive given the extensive inflammatory changes involving the gastroesophageal junction, though an infiltrative neoplasm and associated nodal adenopathy could appear similarly. Shotty adenopathy is also noted within the left periaortic lymph node group as well as the ileocecal lymph node group of the mesentery, nonspecific. No additional pathologic adenopathy within the abdomen and pelvis. The abdominal vasculature is unremarkable.  Reproductive: Prostate is unremarkable. Other: Tiny fat containing umbilical hernia. The rectum is unremarkable. Musculoskeletal: No acute bone abnormality. No lytic or blastic bone lesion. Review of the MIP images confirms the above findings. IMPRESSION: No pulmonary embolism. 3.1 cm soft tissue mass within the anterior mediastinum. Differential considerations are led by thymoma or lymphoma in a patient of this age. Comparison with prior examinations would be helpful in determining chronicity. If none are available, PET CT examination may be helpful for further management. Marked circumferential wall thickening and extensive surrounding inflammatory stranding involving the distal esophagus possibly related to changes of esophagitis. Infiltrative neoplasm, however, could appear similarly. SA seated shotty periceliac adenopathy with borderline pathologic enlargement may simply be reactive in nature, however, nodal infiltration could appear similarly. Correlation with endoscopy or barium swallow is recommended. Follow-up CT examination in 3 months may also be helpful to document stability or resolution of adenopathy. Extensive perivesicular inflammatory stranding and circumferential bladder wall thickening again noted in keeping with changes of infectious or inflammatory cystitis. No inflammatory changes seen involving the upper collecting system bilaterally. Correlation with urinalysis and urine culture is recommended. Electronically Signed   By: Helyn Numbers MD   On: 10/28/2020 02:17   CT Abdomen Pelvis W Contrast  Result Date: 10/28/2020 CLINICAL DATA:  High probability pulmonary embolism. Upper abdominal cramping EXAM: CT ANGIOGRAPHY CHEST CT ABDOMEN AND PELVIS WITH CONTRAST TECHNIQUE: Multidetector CT imaging of the chest was performed using the standard protocol during bolus administration of intravenous contrast. Multiplanar CT image reconstructions and MIPs were obtained to evaluate the vascular anatomy.  Multidetector CT imaging of the abdomen and pelvis was performed using the standard protocol during bolus administration of intravenous contrast. CONTRAST:  OMNIPAQUE IOHEXOL 350 MG/ML SOLN COMPARISON:  9:07 p.m. FINDINGS: CTA CHEST FINDINGS Cardiovascular: There is adequate opacification of the a pulmonary arterial tree. No intraluminal filling defect identified to suggest acute pulmonary embolism. The central pulmonary arteries are of normal caliber. Cardiac size within normal limits. No pericardial effusion. No significant coronary artery calcification. The thoracic aorta is unremarkable. Mediastinum/Nodes: Small hiatal hernia. Marked circumferential thickening of the distal esophagus is present, possibly reflecting changes of esophagitis, such as reflux esophagitis. Note that infiltrative disease could appear similarly. No evidence of obstruction or perforation. A a 2.3 x 2.5 x 3.1 cm soft tissue mass is seen within the anterior mediastinum which is separate from the adjacent left thyroid lobe. This may represent an anterior mediastinal mass such as a thymoma or lymphoma. No pathologic thoracic adenopathy. The thyroid is unremarkable. Lungs/Pleura: Bibasilar atelectasis. No superimposed focal pulmonary infiltrate. No pneumothorax or pleural effusion. The central airways are widely patent. Musculoskeletal: No chest wall abnormality. No acute or significant osseous findings. Review of the MIP images confirms the above findings. CT ABDOMEN and PELVIS FINDINGS Hepatobiliary: No focal liver abnormality is seen. No gallstones, gallbladder wall thickening, or biliary dilatation. Pancreas: Unremarkable Spleen: Unremarkable Adrenals/Urinary Tract:  The adrenal glands are unremarkable. The kidneys are normal. There is extensive perivesicular inflammatory stranding again identified. Mild circumferential bladder wall thickening is again noted. Together, the findings are in keeping with infectious or inflammatory  cystitis. Stomach/Bowel: Stomach is within normal limits. Appendix appears normal. No evidence of bowel wall thickening, distention, or inflammatory changes. No free intraperitoneal gas or fluid. Vascular/Lymphatic: As noted previously, there is shotty periceliac adenopathy with a single pathologically enlarged lymph node measuring 12-13 mm in short axis diameter. This may be reactive given the extensive inflammatory changes involving the gastroesophageal junction, though an infiltrative neoplasm and associated nodal adenopathy could appear similarly. Shotty adenopathy is also noted within the left periaortic lymph node group as well as the ileocecal lymph node group of the mesentery, nonspecific. No additional pathologic adenopathy within the abdomen and pelvis. The abdominal vasculature is unremarkable. Reproductive: Prostate is unremarkable. Other: Tiny fat containing umbilical hernia. The rectum is unremarkable. Musculoskeletal: No acute bone abnormality. No lytic or blastic bone lesion. Review of the MIP images confirms the above findings. IMPRESSION: No pulmonary embolism. 3.1 cm soft tissue mass within the anterior mediastinum. Differential considerations are led by thymoma or lymphoma in a patient of this age. Comparison with prior examinations would be helpful in determining chronicity. If none are available, PET CT examination may be helpful for further management. Marked circumferential wall thickening and extensive surrounding inflammatory stranding involving the distal esophagus possibly related to changes of esophagitis. Infiltrative neoplasm, however, could appear similarly. SA seated shotty periceliac adenopathy with borderline pathologic enlargement may simply be reactive in nature, however, nodal infiltration could appear similarly. Correlation with endoscopy or barium swallow is recommended. Follow-up CT examination in 3 months may also be helpful to document stability or resolution of adenopathy.  Extensive perivesicular inflammatory stranding and circumferential bladder wall thickening again noted in keeping with changes of infectious or inflammatory cystitis. No inflammatory changes seen involving the upper collecting system bilaterally. Correlation with urinalysis and urine culture is recommended. Electronically Signed   By: Helyn Numbers MD   On: 10/28/2020 02:17   VAS [place] LOWER EXTREMITY VENOUS (DVT)  Result Date: 10/28/2020  Lower Venous DVT Study Patient Name:  TAVAUGHN SILGUERO  Date of Exam:   10/28/2020 Medical Rec #: [id number]    Accession #:    [id number] Date of Birth: 01/07/1971    Patient Gender: M Patient Age:   49Y Exam Location:  [facility] Procedure:      VAS [place] LOWER EXTREMITY VENOUS (DVT) Referring Phys: [id number] JARED E SEGAL --------------------------------------------------------------------------------  Indications: Elevated Ddimer.  Risk Factors: None identified. Comparison Study: No prior studies. Performing Technologist: Chanda Busing RVT  Examination Guidelines: A complete evaluation includes B-mode imaging, spectral Doppler, color Doppler, and power Doppler as needed of all accessible portions of each vessel. Bilateral testing is considered an integral part of a complete examination. Limited examinations for reoccurring indications may be performed as noted. The reflux portion of the exam is performed with the patient in reverse Trendelenburg.  +---------+---------------+---------+-----------+----------+--------------+ RIGHT    CompressibilityPhasicitySpontaneityPropertiesThrombus Aging +---------+---------------+---------+-----------+----------+--------------+ CFV      Full           Yes      Yes                                 +---------+---------------+---------+-----------+----------+--------------+ SFJ      Full                                                        +---------+---------------+---------+-----------+----------+--------------+  FV  Prox  Full                                                        +---------+---------------+---------+-----------+----------+--------------+ FV Mid   Full                                                        +---------+---------------+---------+-----------+----------+--------------+ FV DistalFull                                                        +---------+---------------+---------+-----------+----------+--------------+ PFV      Full                                                        +---------+---------------+---------+-----------+----------+--------------+ POP      Full           Yes      Yes                                 +---------+---------------+---------+-----------+----------+--------------+ PTV      Full                                                        +---------+---------------+---------+-----------+----------+--------------+ PERO     Full                                                        +---------+---------------+---------+-----------+----------+--------------+   +---------+---------------+---------+-----------+----------+--------------+ LEFT     CompressibilityPhasicitySpontaneityPropertiesThrombus Aging +---------+---------------+---------+-----------+----------+--------------+ CFV      Full           Yes      Yes                                 +---------+---------------+---------+-----------+----------+--------------+ SFJ      Full                                                        +---------+---------------+---------+-----------+----------+--------------+ FV Prox  Full                                                        +---------+---------------+---------+-----------+----------+--------------+  FV Mid   Full                                                        +---------+---------------+---------+-----------+----------+--------------+ FV DistalFull                                                         +---------+---------------+---------+-----------+----------+--------------+ PFV      Full                                                        +---------+---------------+---------+-----------+----------+--------------+ POP      Full           Yes      Yes                                 +---------+---------------+---------+-----------+----------+--------------+ PTV      Full                                                        +---------+---------------+---------+-----------+----------+--------------+ PERO     Full                                                        +---------+---------------+---------+-----------+----------+--------------+     Summary: RIGHT: - There is no evidence of deep vein thrombosis in the lower extremity.  - No cystic structure found in the popliteal fossa.  LEFT: - There is no evidence of deep vein thrombosis in the lower extremity.  - No cystic structure found in the popliteal fossa.  *See table(s) above for measurements and observations. Electronically signed by Fabienne Bruns MD on 10/28/2020 at 4:31:02 PM.    Final     Microbiology: Recent Results (from the past 240 hour(s))  Urine culture     Status: None   Collection Time: 10/27/20 10:18 PM   Specimen: Urine, Random  Result Value Ref Range Status   Specimen Description   Final    URINE, RANDOM Performed at [facility], [address] [place], [place] [address]    Special Requests   Final    NONE Performed at [facility], [address] [place], [place] [address]    Culture   Final    NO GROWTH Performed at [facility], [address] [place], [place] [address]    Report Status 10/29/2020 FINAL  Final  Resp Panel by RT-PCR (Flu A&B, Covid) Nasopharyngeal Swab     Status: None   Collection Time: 10/28/20 12:25 AM   Specimen: Nasopharyngeal Swab; Nasopharyngeal(NP) swabs in vial transport  medium  Result Value Ref Range  Status   SARS Coronavirus 2 by RT PCR NEGATIVE NEGATIVE Final    Comment: (NOTE) SARS-CoV-2 target nucleic acids are NOT DETECTED.  The SARS-CoV-2 RNA is generally detectable in upper respiratory specimens during the acute phase of infection. The lowest concentration of SARS-CoV-2 viral copies this assay can detect is 138 copies/mL. A negative result does not preclude SARS-Cov-2 infection and should not be used as the sole basis for treatment or other patient management decisions. A negative result may occur with  improper specimen collection/handling, submission of specimen other than nasopharyngeal swab, presence of viral mutation(s) within the areas targeted by this assay, and inadequate number of viral copies(<138 copies/mL). A negative result must be combined with clinical observations, patient history, and epidemiological information. The expected result is Negative.  Fact Sheet for Patients:  BloggerCourse.com  Fact Sheet for Healthcare Providers:  SeriousBroker.it  This test is no t yet approved or cleared by the [place] FDA and  has been authorized for detection and/or diagnosis of SARS-CoV-2 by FDA under an Emergency Use Authorization (EUA). This EUA will remain  in effect (meaning this test can be used) for the duration of the COVID-19 declaration under Section 564(b)(1) of the Act, 21 U.S.C.section 360bbb-3(b)(1), unless the authorization is terminated  or revoked sooner.       Influenza A by PCR NEGATIVE NEGATIVE Final   Influenza B by PCR NEGATIVE NEGATIVE Final    Comment: (NOTE) The Xpert Xpress SARS-CoV-2/FLU/RSV plus assay is intended as an aid in the diagnosis of influenza from Nasopharyngeal swab specimens and should not be used as a sole basis for treatment. Nasal washings and aspirates are unacceptable for Xpert Xpress SARS-CoV-2/FLU/RSV testing.  Fact Sheet for  Patients: BloggerCourse.com  Fact Sheet for Healthcare Providers: SeriousBroker.it  This test is not yet approved or cleared by the [place] FDA and has been authorized for detection and/or diagnosis of SARS-CoV-2 by FDA under an Emergency Use Authorization (EUA). This EUA will remain in effect (meaning this test can be used) for the duration of the COVID-19 declaration under Section 564(b)(1) of the Act, 21 U.S.C. section 360bbb-3(b)(1), unless the authorization is terminated or revoked.  Performed at [facility], [address] [place], [place] [address]      Labs: Basic Metabolic Panel: Recent Labs  Lab 10/27/20 1741 10/28/20 1601 10/29/20 0447 10/30/20 0440  NA 135 139 138 138  K 4.2 4.4 4.3 4.1  CL 103 107 107 106  CO2 22 24 22 23   GLUCOSE 98 75 90 85  BUN 14 12 10 9   CREATININE 1.05 1.10 1.19 1.01  CALCIUM 9.1 8.9 8.8* 8.6*   Liver Function Tests: Recent Labs  Lab 10/27/20 1741 10/28/20 1601  AST 24 15  ALT 37 28  ALKPHOS 67 62  BILITOT 0.3 0.5  PROT 7.6 6.6  ALBUMIN 3.8 3.3*   Recent Labs  Lab 10/27/20 1741  LIPASE 40   No results for input(s): AMMONIA in the last 168 hours. CBC: Recent Labs  Lab 10/27/20 1741 10/28/20 1601 10/29/20 0447 10/30/20 0440  WBC 9.1 8.2 7.7 6.8  HGB 15.9 14.6 13.4 12.7*  HCT 48.1 45.3 42.1 39.7  MCV 87.5 90.1 90.9 90.4  PLT 306 306 286 284   Cardiac Enzymes: No results for input(s): CKTOTAL, CKMB, CKMBINDEX, TROPONINI in the last 168 hours. BNP: BNP (last 3 results) Recent Labs    10/28/20 0005  BNP 15.7  ProBNP (last 3 results) No results for input(s): PROBNP in the last 8760 hours.  CBG: No results for input(s): GLUCAP in the last 168 hours.     Signed:  Zannie Cove MD.  [facility] 10/30/2020, 2:45 PM

## 2020-10-30 NOTE — Progress Notes (Signed)
Erroneous encounter

## 2020-10-31 ENCOUNTER — Encounter (HOSPITAL_COMMUNITY): Payer: Self-pay | Admitting: Internal Medicine

## 2020-10-31 ENCOUNTER — Telehealth: Payer: Self-pay | Admitting: Hematology and Oncology

## 2020-10-31 NOTE — Telephone Encounter (Signed)
Spoke to patient to give appointment for PET scan, I gave location and time and the prep for the scan

## 2020-11-01 ENCOUNTER — Other Ambulatory Visit: Payer: Self-pay | Admitting: Hematology and Oncology

## 2020-11-01 DIAGNOSIS — J9859 Other diseases of mediastinum, not elsewhere classified: Secondary | ICD-10-CM

## 2020-11-02 LAB — AFP TUMOR MARKER: AFP, Serum, Tumor Marker: 1 ng/mL (ref 0.0–6.9)

## 2020-11-03 ENCOUNTER — Telehealth: Payer: Self-pay

## 2020-11-03 NOTE — Telephone Encounter (Signed)
-----   Message from Artis Delay, MD sent at 11/03/2020  7:33 AM EDT ----- He was last seen in the hospital Can you call him? He needs to get a few more labs done this week or next, remind him of PET CT appt and pls schedule appt to see me Tuesday 6/28 for 20 mins

## 2020-11-03 NOTE — Telephone Encounter (Signed)
Called and given below message. He verbalized understanding. Appts scheduled and he is aware of appt times.

## 2020-11-05 ENCOUNTER — Other Ambulatory Visit: Payer: Self-pay

## 2020-11-05 ENCOUNTER — Inpatient Hospital Stay: Payer: Self-pay | Attending: Hematology and Oncology

## 2020-11-05 DIAGNOSIS — J9859 Other diseases of mediastinum, not elsewhere classified: Secondary | ICD-10-CM

## 2020-11-05 DIAGNOSIS — R222 Localized swelling, mass and lump, trunk: Secondary | ICD-10-CM | POA: Insufficient documentation

## 2020-11-05 LAB — CBC WITH DIFFERENTIAL/PLATELET
Abs Immature Granulocytes: 0.01 10*3/uL (ref 0.00–0.07)
Basophils Absolute: 0 10*3/uL (ref 0.0–0.1)
Basophils Relative: 1 %
Eosinophils Absolute: 0.4 10*3/uL (ref 0.0–0.5)
Eosinophils Relative: 7 %
HCT: 42.1 % (ref 39.0–52.0)
Hemoglobin: 13.8 g/dL (ref 13.0–17.0)
Immature Granulocytes: 0 %
Lymphocytes Relative: 38 %
Lymphs Abs: 2.1 10*3/uL (ref 0.7–4.0)
MCH: 28.6 pg (ref 26.0–34.0)
MCHC: 32.8 g/dL (ref 30.0–36.0)
MCV: 87.2 fL (ref 80.0–100.0)
Monocytes Absolute: 0.4 10*3/uL (ref 0.1–1.0)
Monocytes Relative: 7 %
Neutro Abs: 2.6 10*3/uL (ref 1.7–7.7)
Neutrophils Relative %: 47 %
Platelets: 347 10*3/uL (ref 150–400)
RBC: 4.83 MIL/uL (ref 4.22–5.81)
RDW: 13.1 % (ref 11.5–15.5)
WBC: 5.6 10*3/uL (ref 4.0–10.5)
nRBC: 0 % (ref 0.0–0.2)

## 2020-11-05 LAB — LACTATE DEHYDROGENASE: LDH: 223 U/L — ABNORMAL HIGH (ref 98–192)

## 2020-11-06 LAB — BETA HCG QUANT (REF LAB): hCG Quant: <1 m[IU]/mL (ref 0–3)

## 2020-11-17 ENCOUNTER — Encounter (HOSPITAL_COMMUNITY)
Admission: RE | Admit: 2020-11-17 | Discharge: 2020-11-17 | Disposition: A | Discharge status: Home / Self Care | Payer: Self-pay | Source: Ambulatory Visit | Attending: Hematology and Oncology | Admitting: Hematology and Oncology

## 2020-11-17 ENCOUNTER — Other Ambulatory Visit: Payer: Self-pay

## 2020-11-17 DIAGNOSIS — J9859 Other diseases of mediastinum, not elsewhere classified: Secondary | ICD-10-CM | POA: Insufficient documentation

## 2020-11-17 LAB — GLUCOSE, CAPILLARY: Glucose-Capillary: 104 mg/dL — ABNORMAL HIGH (ref 70–99)

## 2020-11-17 MED ORDER — FLUDEOXYGLUCOSE F - 18 (FDG) INJECTION
11.9000 | Freq: Once | INTRAVENOUS | Status: AC
  Administered 2020-11-17: 12.9 via INTRAVENOUS

## 2020-11-18 ENCOUNTER — Telehealth: Payer: Self-pay

## 2020-11-18 ENCOUNTER — Encounter: Payer: Self-pay | Admitting: Thoracic Surgery (Cardiothoracic Vascular Surgery)

## 2020-11-18 ENCOUNTER — Ambulatory Visit: Payer: Self-pay | Admitting: Hematology and Oncology

## 2020-11-18 NOTE — Telephone Encounter (Signed)
Called regarding today's missed appt. He said he called the scheduler to cancel and was waiting on call back. Moved appt to Thursday at 8 am. He is aware of appt time.

## 2020-11-19 ENCOUNTER — Encounter: Payer: Self-pay | Admitting: Thoracic Surgery (Cardiothoracic Vascular Surgery)

## 2020-11-20 ENCOUNTER — Telehealth: Payer: Self-pay | Admitting: Hematology and Oncology

## 2020-11-20 ENCOUNTER — Other Ambulatory Visit: Payer: Self-pay

## 2020-11-20 ENCOUNTER — Inpatient Hospital Stay (HOSPITAL_BASED_OUTPATIENT_CLINIC_OR_DEPARTMENT_OTHER): Payer: Self-pay | Admitting: Hematology and Oncology

## 2020-11-20 ENCOUNTER — Encounter: Payer: Self-pay | Admitting: Thoracic Surgery (Cardiothoracic Vascular Surgery)

## 2020-11-20 ENCOUNTER — Encounter: Payer: Self-pay | Admitting: Hematology and Oncology

## 2020-11-20 VITALS — BP 167/88 | HR 92 | Temp 98.8°F | Resp 18 | Wt 247.8 lb

## 2020-11-20 DIAGNOSIS — J358 Other chronic diseases of tonsils and adenoids: Secondary | ICD-10-CM | POA: Insufficient documentation

## 2020-11-20 DIAGNOSIS — J9859 Other diseases of mediastinum, not elsewhere classified: Secondary | ICD-10-CM

## 2020-11-20 MED ORDER — AMOXICILLIN 500 MG PO CAPS: 500.0000 mg | ORAL_CAPSULE | Freq: Two times a day (BID) | ORAL | 0 refills | Status: AC

## 2020-11-20 NOTE — Assessment & Plan Note (Signed)
He has slight abnormality in the tonsillar region with surrounding lymphadenopathy with slight FDG avid activity His oral exam is unremarkable The patient is not a smoker He has no symptoms of sore throat It could either be benign reactive changes related to slight infection versus malignancy We discussed the risk and benefits of ENT consult He is willing to try a week course of amoxicillin antibiotics and repeat in 3 months The patient is educated to watch out for worsening sore throat or lymphadenopathy and if he have those symptoms he will call me back sooner

## 2020-11-20 NOTE — Assessment & Plan Note (Signed)
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

## 2020-11-20 NOTE — Progress Notes (Signed)
[facility] OFFICE PROGRESS NOTE  Patient Care Team: Patient, No Pcp Per (Inactive) as PCP - General (General Practice)  ASSESSMENT & PLAN:  Mediastinal mass His tumor marker is unremarkable PET CT scan showed no significant activity in the mediastinal mass I suspect it is either thyroid tissue versus benign thymic tissue I think surveillance imaging is appropriate I do not feel strongly he needs excisional biopsy or resection Plan to repeat imaging study in 3 months  Tonsil asymmetry He has slight abnormality in the tonsillar region with surrounding lymphadenopathy with slight FDG avid activity His oral exam is unremarkable The patient is not a smoker He has no symptoms of sore throat It could either be benign reactive changes related to slight infection versus malignancy We discussed the risk and benefits of ENT consult He is willing to try a week course of amoxicillin antibiotics and repeat in 3 months The patient is educated to watch out for worsening sore throat or lymphadenopathy and if he have those symptoms he will call me back sooner  Orders Placed This Encounter  Procedures   NM PET Image Restage (PS) Skull Base to Thigh    Standing Status:   Future    Standing Expiration Date:   11/20/2021    Order Specific Question:   If indicated for the ordered procedure, I authorize the administration of a radiopharmaceutical per Radiology protocol    Answer:   Yes    Order Specific Question:   Preferred imaging location?    Answer:   [facility]    Order Specific Question:   Radiology Contrast Protocol - do NOT remove file path    Answer:   [email or web address]   CBC with Differential/Platelet    Standing Status:   Future    Standing Expiration Date:   11/20/2021   Comprehensive metabolic panel    Standing Status:   Future    Standing Expiration Date:   11/20/2021   Lactate dehydrogenase    Standing Status:   Future     Standing Expiration Date:   11/20/2021    All questions were answered. The patient knows to call the clinic with any problems, questions or concerns. The total time spent in the appointment was 20 minutes encounter with patients including review of chart and various tests results, discussions about plan of care and coordination of care plan   Artis Delay, MD 11/20/2020 9:59 AM  INTERVAL HISTORY: Please see below for problem oriented charting. He returns for further follow-up He denies sore throat No chest pain No recent infection, fever or chills  SUMMARY OF ONCOLOGIC HISTORY: Please see my original consult note from June 7 for further details  Jeffrey Glover is a 50 year old male with a past medical history significant for GERD, eosinophilic esophagitis, severe esophageal stenosis.  He presented to the emergency room with a 1 week history of epigastric pain that worsened with breathing.  He initially had nausea and indigestion with sharp and crampy upper abdominal pain.  He reports a poor appetite, abdominal bloating, and pain that worsens with eating.  Initial labs showed a normal CBC and CMET.  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  He had marked circumferential wall thickening and extensive surrounding inflammatory stranding involving the distal esophagus possibly related to change of esophagitis but infiltrative neoplasm could appear  similarly.  He has shotty periceliac adenopathy which is borderline pathologic and may be reactive but nodal infiltration could appear similarly.  He was discharged from the hospital with plan for PET/CT imaging and blood work including tumor markers to rule out germ cell tumor   REVIEW OF SYSTEMS:   Constitutional: Denies fevers, chills or abnormal weight loss Eyes: Denies blurriness of vision Ears,  nose, mouth, throat, and face: Denies mucositis or sore throat Respiratory: Denies cough, dyspnea or wheezes Cardiovascular: Denies palpitation, chest discomfort or lower extremity swelling Gastrointestinal:  Denies nausea, heartburn or change in bowel habits Skin: Denies abnormal skin rashes Lymphatics: Denies new lymphadenopathy or easy bruising Neurological:Denies numbness, tingling or new weaknesses Behavioral/Psych: Mood is stable, no new changes  All other systems were reviewed with the patient and are negative.  I have reviewed the past medical history, past surgical history, social history and family history with the patient and they are unchanged from previous note.  ALLERGIES:  is allergic to other.  MEDICATIONS:  Current Outpatient Medications  Medication Sig Dispense Refill   acetaminophen (TYLENOL) 325 MG tablet Take 2 tablets (650 mg total) by mouth every 6 (six) hours as needed for mild pain (or Fever >/= 101).     amoxicillin (AMOXIL) 500 MG capsule Take 1 capsule (500 mg total) by mouth 2 (two) times daily for 7 days. 14 capsule 0   pantoprazole (PROTONIX) 40 MG tablet Take 1 tablet (40 mg total) by mouth 2 (two) times daily before a meal. 60 tablet 1   No current facility-administered medications for this visit.    PHYSICAL EXAMINATION: ECOG PERFORMANCE STATUS: 0 - Asymptomatic  Vitals:   11/20/20 0817  BP: (!) 167/88  Pulse: 92  Resp: 18  Temp: 98.8 F (37.1 C)  SpO2: 97%   Filed Weights   11/20/20 0817  Weight: 247 lb 12.8 oz (112.4 kg)    GENERAL:alert, no distress and comfortable SKIN: skin color, texture, turgor are normal, no rashes or significant lesions EYES: normal, Conjunctiva are pink and non-injected, sclera clear OROPHARYNX:no exudate, no erythema and lips, buccal mucosa, and tongue normal  NECK: supple, thyroid normal size, non-tender, without nodularity LYMPH:  no palpable lymphadenopathy in the cervical, axillary or inguinal LUNGS:  clear to auscultation and percussion with normal breathing effort HEART: regular rate & rhythm and no murmurs and no lower extremity edema ABDOMEN:abdomen soft, non-tender and normal bowel sounds Musculoskeletal:no cyanosis of digits and no clubbing  NEURO: alert & oriented x 3 with fluent speech, no focal motor/sensory deficits  LABORATORY DATA:  I have reviewed the data as listed    Component Value Date/Time   NA 138 10/30/2020 0440   K 4.1 10/30/2020 0440   CL 106 10/30/2020 0440   CO2 23 10/30/2020 0440   GLUCOSE 85 10/30/2020 0440   BUN 9 10/30/2020 0440   CREATININE 1.01 10/30/2020 0440   CALCIUM 8.6 (L) 10/30/2020 0440   PROT 6.6 10/28/2020 1601   ALBUMIN 3.3 (L) 10/28/2020 1601   AST 15 10/28/2020 1601   ALT 28 10/28/2020 1601   ALKPHOS 62 10/28/2020 1601   BILITOT 0.5 10/28/2020 1601   GFRNONAA >60 10/30/2020 0440   GFRAA >60 08/12/2015 1737    No results found for: SPEP, UPEP  Lab Results  Component Value Date   WBC 5.6 11/05/2020   NEUTROABS 2.6 11/05/2020   HGB 13.8 11/05/2020   HCT 42.1 11/05/2020   MCV 87.2 11/05/2020   PLT 347 11/05/2020  Chemistry      Component Value Date/Time   NA 138 10/30/2020 0440   K 4.1 10/30/2020 0440   CL 106 10/30/2020 0440   CO2 23 10/30/2020 0440   BUN 9 10/30/2020 0440   CREATININE 1.01 10/30/2020 0440      Component Value Date/Time   CALCIUM 8.6 (L) 10/30/2020 0440   ALKPHOS 62 10/28/2020 1601   AST 15 10/28/2020 1601   ALT 28 10/28/2020 1601   BILITOT 0.5 10/28/2020 1601       RADIOGRAPHIC STUDIES: I have reviewed imaging study with the patient I have personally reviewed the radiological images as listed and agreed with the findings in the report. CT Abdomen Pelvis Wo Contrast  Result Date: 10/27/2020 CLINICAL DATA:  Acute abdominal pain and cramping, nausea EXAM: CT ABDOMEN AND PELVIS WITHOUT CONTRAST TECHNIQUE: Multidetector CT imaging of the abdomen and pelvis was performed following the standard  protocol without IV contrast. COMPARISON:  None. FINDINGS: Lower chest: Bibasilar atelectasis. The visualized heart and pericardium are unremarkable. Moderate hiatal hernia. Circumferential thickening of the distal esophagus may reflect changes of esophagitis, such as reflux esophagitis. Hepatobiliary: No focal liver abnormality is seen. No gallstones, gallbladder wall thickening, or biliary dilatation. Pancreas: Unremarkable Spleen: Unremarkable Adrenals/Urinary Tract: The adrenal glands are unremarkable. The kidneys are normal. There is extensive perivesicular inflammatory stranding and mild circumferential bladder wall thickening in keeping with changes of infectious or inflammatory cystitis. The bladder is largely decompressed. Stomach/Bowel: Stomach is within normal limits. Appendix appears normal. No evidence of bowel wall thickening, distention, or inflammatory changes. No free intraperitoneal gas or fluid. Vascular/Lymphatic: Several shotty periceliac lymph nodes are identified with a single lymph node demonstrating pathologic enlargement measuring 13 mm in short axis diameter. This is nonspecific, but may relate to the inflammatory changes noted within the region of the gastroesophageal junction. An infiltrative mass with associated nodal involvement, however, could also appear similarly. Shotty retroperitoneal adenopathy is identified within the left periaortic lymph node group without pathologic enlargement. Similarly, several prominent lymph nodes are identified within the ileocecal lymph node group of the mesentery, again without pathologic enlargement. The abdominal vasculature is unremarkable. Reproductive: Prostate is unremarkable. Other: Tiny fat containing umbilical hernia. The rectum is unremarkable. Musculoskeletal: No acute bone abnormality. No lytic or blastic bone lesions are seen. IMPRESSION: Marked perivesicular inflammatory stranding and mild circumferential bladder wall thickening in  keeping with an underlying infectious or inflammatory cystitis. Correlation with urinalysis and urine culture may be helpful. The bladder is not distended. No inflammatory changes identified involving the upper collecting system bilaterally. Small hiatal hernia with marked circumferential thickening involving the distal esophagus, possibly reflecting changes of reflux esophagitis. Note that an infiltrative mass, however, could appear similarly and should be considered given the a particularly prominent periceliac adenopathy. Correlation with barium swallow or endoscopy would be helpful for further evaluation once the patient's acute issues have resolved. Follow-up CT or MRI evaluation may be helpful, additionally, in 3 months to document either stability or resolution of celiac adenopathy if prior examinations are unavailable to confirm stability. Electronically Signed   By: Helyn Numbers MD   On: 10/27/2020 21:25   DG Chest 2 View  Result Date: 10/27/2020 CLINICAL DATA:  Initial evaluation for acute shortness of breath. EXAM: CHEST - 2 VIEW COMPARISON:  Prior radiograph from 08/12/2015. FINDINGS: Cardiac and mediastinal silhouettes are stable, and remain within normal limits. Lungs mildly hypoinflated with elevation of the right hemidiaphragm. Minimal linear bibasilar subsegmental atelectasis. No focal infiltrates. Blunting  of the left costophrenic angle suggestive of a trace left pleural effusion. No pulmonary edema. No pneumothorax. No acute osseous finding. IMPRESSION: 1. Trace left pleural effusion. 2. Elevation of the right hemidiaphragm with associated mild bibasilar subsegmental atelectasis. 3. No other active cardiopulmonary disease. Electronically Signed   By: Rise Mu M.D.   On: 10/27/2020 21:12   CT Angio Chest PE W/Cm &/Or Wo Cm  Result Date: 10/28/2020 CLINICAL DATA:  High probability pulmonary embolism. Upper abdominal cramping EXAM: CT ANGIOGRAPHY CHEST CT ABDOMEN AND PELVIS WITH  CONTRAST TECHNIQUE: Multidetector CT imaging of the chest was performed using the standard protocol during bolus administration of intravenous contrast. Multiplanar CT image reconstructions and MIPs were obtained to evaluate the vascular anatomy. Multidetector CT imaging of the abdomen and pelvis was performed using the standard protocol during bolus administration of intravenous contrast. CONTRAST:  OMNIPAQUE IOHEXOL 350 MG/ML SOLN COMPARISON:  9:07 p.m. FINDINGS: CTA CHEST FINDINGS Cardiovascular: There is adequate opacification of the a pulmonary arterial tree. No intraluminal filling defect identified to suggest acute pulmonary embolism. The central pulmonary arteries are of normal caliber. Cardiac size within normal limits. No pericardial effusion. No significant coronary artery calcification. The thoracic aorta is unremarkable. Mediastinum/Nodes: Small hiatal hernia. Marked circumferential thickening of the distal esophagus is present, possibly reflecting changes of esophagitis, such as reflux esophagitis. Note that infiltrative disease could appear similarly. No evidence of obstruction or perforation. A a 2.3 x 2.5 x 3.1 cm soft tissue mass is seen within the anterior mediastinum which is separate from the adjacent left thyroid lobe. This may represent an anterior mediastinal mass such as a thymoma or lymphoma. No pathologic thoracic adenopathy. The thyroid is unremarkable. Lungs/Pleura: Bibasilar atelectasis. No superimposed focal pulmonary infiltrate. No pneumothorax or pleural effusion. The central airways are widely patent. Musculoskeletal: No chest wall abnormality. No acute or significant osseous findings. Review of the MIP images confirms the above findings. CT ABDOMEN and PELVIS FINDINGS Hepatobiliary: No focal liver abnormality is seen. No gallstones, gallbladder wall thickening, or biliary dilatation. Pancreas: Unremarkable Spleen: Unremarkable Adrenals/Urinary Tract: The adrenal glands are  unremarkable. The kidneys are normal. There is extensive perivesicular inflammatory stranding again identified. Mild circumferential bladder wall thickening is again noted. Together, the findings are in keeping with infectious or inflammatory cystitis. Stomach/Bowel: Stomach is within normal limits. Appendix appears normal. No evidence of bowel wall thickening, distention, or inflammatory changes. No free intraperitoneal gas or fluid. Vascular/Lymphatic: As noted previously, there is shotty periceliac adenopathy with a single pathologically enlarged lymph node measuring 12-13 mm in short axis diameter. This may be reactive given the extensive inflammatory changes involving the gastroesophageal junction, though an infiltrative neoplasm and associated nodal adenopathy could appear similarly. Shotty adenopathy is also noted within the left periaortic lymph node group as well as the ileocecal lymph node group of the mesentery, nonspecific. No additional pathologic adenopathy within the abdomen and pelvis. The abdominal vasculature is unremarkable. Reproductive: Prostate is unremarkable. Other: Tiny fat containing umbilical hernia. The rectum is unremarkable. Musculoskeletal: No acute bone abnormality. No lytic or blastic bone lesion. Review of the MIP images confirms the above findings. IMPRESSION: No pulmonary embolism. 3.1 cm soft tissue mass within the anterior mediastinum. Differential considerations are led by thymoma or lymphoma in a patient of this age. Comparison with prior examinations would be helpful in determining chronicity. If none are available, PET CT examination may be helpful for further management. Marked circumferential wall thickening and extensive surrounding inflammatory stranding involving the distal esophagus  possibly related to changes of esophagitis. Infiltrative neoplasm, however, could appear similarly. SA seated shotty periceliac adenopathy with borderline pathologic enlargement may simply  be reactive in nature, however, nodal infiltration could appear similarly. Correlation with endoscopy or barium swallow is recommended. Follow-up CT examination in 3 months may also be helpful to document stability or resolution of adenopathy. Extensive perivesicular inflammatory stranding and circumferential bladder wall thickening again noted in keeping with changes of infectious or inflammatory cystitis. No inflammatory changes seen involving the upper collecting system bilaterally. Correlation with urinalysis and urine culture is recommended. Electronically Signed   By: Helyn Numbers MD   On: 10/28/2020 02:17   CT Abdomen Pelvis W Contrast  Result Date: 10/28/2020 CLINICAL DATA:  High probability pulmonary embolism. Upper abdominal cramping EXAM: CT ANGIOGRAPHY CHEST CT ABDOMEN AND PELVIS WITH CONTRAST TECHNIQUE: Multidetector CT imaging of the chest was performed using the standard protocol during bolus administration of intravenous contrast. Multiplanar CT image reconstructions and MIPs were obtained to evaluate the vascular anatomy. Multidetector CT imaging of the abdomen and pelvis was performed using the standard protocol during bolus administration of intravenous contrast. CONTRAST:  OMNIPAQUE IOHEXOL 350 MG/ML SOLN COMPARISON:  9:07 p.m. FINDINGS: CTA CHEST FINDINGS Cardiovascular: There is adequate opacification of the a pulmonary arterial tree. No intraluminal filling defect identified to suggest acute pulmonary embolism. The central pulmonary arteries are of normal caliber. Cardiac size within normal limits. No pericardial effusion. No significant coronary artery calcification. The thoracic aorta is unremarkable. Mediastinum/Nodes: Small hiatal hernia. Marked circumferential thickening of the distal esophagus is present, possibly reflecting changes of esophagitis, such as reflux esophagitis. Note that infiltrative disease could appear similarly. No evidence of obstruction or perforation. A a  2.3 x 2.5 x 3.1 cm soft tissue mass is seen within the anterior mediastinum which is separate from the adjacent left thyroid lobe. This may represent an anterior mediastinal mass such as a thymoma or lymphoma. No pathologic thoracic adenopathy. The thyroid is unremarkable. Lungs/Pleura: Bibasilar atelectasis. No superimposed focal pulmonary infiltrate. No pneumothorax or pleural effusion. The central airways are widely patent. Musculoskeletal: No chest wall abnormality. No acute or significant osseous findings. Review of the MIP images confirms the above findings. CT ABDOMEN and PELVIS FINDINGS Hepatobiliary: No focal liver abnormality is seen. No gallstones, gallbladder wall thickening, or biliary dilatation. Pancreas: Unremarkable Spleen: Unremarkable Adrenals/Urinary Tract: The adrenal glands are unremarkable. The kidneys are normal. There is extensive perivesicular inflammatory stranding again identified. Mild circumferential bladder wall thickening is again noted. Together, the findings are in keeping with infectious or inflammatory cystitis. Stomach/Bowel: Stomach is within normal limits. Appendix appears normal. No evidence of bowel wall thickening, distention, or inflammatory changes. No free intraperitoneal gas or fluid. Vascular/Lymphatic: As noted previously, there is shotty periceliac adenopathy with a single pathologically enlarged lymph node measuring 12-13 mm in short axis diameter. This may be reactive given the extensive inflammatory changes involving the gastroesophageal junction, though an infiltrative neoplasm and associated nodal adenopathy could appear similarly. Shotty adenopathy is also noted within the left periaortic lymph node group as well as the ileocecal lymph node group of the mesentery, nonspecific. No additional pathologic adenopathy within the abdomen and pelvis. The abdominal vasculature is unremarkable. Reproductive: Prostate is unremarkable. Other: Tiny fat containing umbilical  hernia. The rectum is unremarkable. Musculoskeletal: No acute bone abnormality. No lytic or blastic bone lesion. Review of the MIP images confirms the above findings. IMPRESSION: No pulmonary embolism. 3.1 cm soft tissue mass within the anterior mediastinum. Differential  considerations are led by thymoma or lymphoma in a patient of this age. Comparison with prior examinations would be helpful in determining chronicity. If none are available, PET CT examination may be helpful for further management. Marked circumferential wall thickening and extensive surrounding inflammatory stranding involving the distal esophagus possibly related to changes of esophagitis. Infiltrative neoplasm, however, could appear similarly. SA seated shotty periceliac adenopathy with borderline pathologic enlargement may simply be reactive in nature, however, nodal infiltration could appear similarly. Correlation with endoscopy or barium swallow is recommended. Follow-up CT examination in 3 months may also be helpful to document stability or resolution of adenopathy. Extensive perivesicular inflammatory stranding and circumferential bladder wall thickening again noted in keeping with changes of infectious or inflammatory cystitis. No inflammatory changes seen involving the upper collecting system bilaterally. Correlation with urinalysis and urine culture is recommended. Electronically Signed   By: Helyn NumbersAshesh  Parikh MD   On: 10/28/2020 02:17   NM PET Image Initial (PI) Skull Base To Thigh  Result Date: 11/18/2020 CLINICAL DATA:  Initial treatment strategy for mediastinal mass EXAM: NUCLEAR MEDICINE PET SKULL BASE TO THIGH TECHNIQUE: 12.9 mCi F-18 FDG was injected intravenously. Full-ring PET imaging was performed from the skull base to thigh after the radiotracer. CT data was obtained and used for attenuation correction and anatomic localization. Fasting blood glucose: 104 mg/dl COMPARISON:  CT chest 16/10/960406/11/2020. FINDINGS: Mediastinal blood  pool activity: SUV max 2.6 Liver activity: SUV max NA NECK: Hypermetabolic LEFT level II lymph node measures 12 mm (image 31) with SUV max equal 8.0. Asymmetric hypermetabolic activity in the LEFT lingual tonsil region with SUV max equal 6.7. Incidental CT findings: none CHEST: The anterior mediastinal mass measures 3.2 by 2.0 cm. The mass is more dense than muscle slightly less dense than thyroid tissue. Mass has very low metabolic activity (SUV max equal 1.9) which is less than background blood pool activity. Mass is smoothly marginated without evidence of invasion of mediastinal structures. No hypermetabolic mediastinal nodes or hilar nodes. No hypermetabolic activity associated the esophagus. Incidental CT findings: Chronic elevation of the RIGHT hemidiaphragm with associated atelectasis. Nodular density on image 79/4 is favored an end on vessel. Mild RIGHT basilar atelectasis improved from comparison CT. ABDOMEN/PELVIS: No abnormal hypermetabolic activity within the liver, pancreas, adrenal glands, or spleen. No hypermetabolic lymph nodes in the abdomen or pelvis. Incidental CT findings: none SKELETON: No focal hypermetabolic activity to suggest skeletal metastasis. Incidental CT findings: none IMPRESSION: 1. Very low metabolic activity associated with anterior mediastinal mass. Differential includes thymoma versus germ cell tumor versus rare ectopic thyroid tissue. Lymphoma would not be favored with very low metabolic activity. Thymic carcinoma is also less favored. 2. Hypermetabolic LEFT level II lymph node and asymmetric hypermetabolic activity in LEFT lingual tonsil region. This may be of inflammatory process involving the hypopharynx. Recommend clinical correlation and consider direct visualization as needed. 3. Improved inflammation the distal esophagus compared to CT 10/28/2020. Electronically Signed   By: Genevive BiStewart  Edmunds M.D.   On: 11/18/2020 08:32   VAS US LOWER EXTREMITY VENOUS (DVT)  Result  Date: 10/28/2020  Lower Venous DVT Study Patient Name:  Jeffrey Glover  Date of Exam:   10/28/2020 Medical Rec #: [id number]    Accession #:    [id number] Date of Birth: 03/02/1971    Patient Gender: M Patient Age:   49Y [facility] Procedure:      VAS US LOWER EXTREMITY VENOUS (DVT) Referring Phys: [id number] JARED E SEGAL --------------------------------------------------------------------------------  Indications: Elevated Ddimer.  Risk Factors: None identified. Comparison Study: No prior studies. Performing Technologist: Chanda Busing RVT  Examination Guidelines: A complete evaluation includes B-mode imaging, spectral Doppler, color Doppler, and power Doppler as needed of all accessible portions of each vessel. Bilateral testing is considered an integral part of a complete examination. Limited examinations for reoccurring indications may be performed as noted. The reflux portion of the exam is performed with the patient in reverse Trendelenburg.  +---------+---------------+---------+-----------+----------+--------------+ RIGHT    CompressibilityPhasicitySpontaneityPropertiesThrombus Aging +---------+---------------+---------+-----------+----------+--------------+ CFV      Full           Yes      Yes                                 +---------+---------------+---------+-----------+----------+--------------+ SFJ      Full                                                        +---------+---------------+---------+-----------+----------+--------------+ FV Prox  Full                                                        +---------+---------------+---------+-----------+----------+--------------+ FV Mid   Full                                                        +---------+---------------+---------+-----------+----------+--------------+ FV DistalFull                                                         +---------+---------------+---------+-----------+----------+--------------+ PFV      Full                                                        +---------+---------------+---------+-----------+----------+--------------+ POP      Full           Yes      Yes                                 +---------+---------------+---------+-----------+----------+--------------+ PTV      Full                                                        +---------+---------------+---------+-----------+----------+--------------+ PERO     Full                                                        +---------+---------------+---------+-----------+----------+--------------+   +---------+---------------+---------+-----------+----------+--------------+  LEFT     CompressibilityPhasicitySpontaneityPropertiesThrombus Aging +---------+---------------+---------+-----------+----------+--------------+ CFV      Full           Yes      Yes                                 +---------+---------------+---------+-----------+----------+--------------+ SFJ      Full                                                        +---------+---------------+---------+-----------+----------+--------------+ FV Prox  Full                                                        +---------+---------------+---------+-----------+----------+--------------+ FV Mid   Full                                                        +---------+---------------+---------+-----------+----------+--------------+ FV DistalFull                                                        +---------+---------------+---------+-----------+----------+--------------+ PFV      Full                                                        +---------+---------------+---------+-----------+----------+--------------+ POP      Full           Yes      Yes                                  +---------+---------------+---------+-----------+----------+--------------+ PTV      Full                                                        +---------+---------------+---------+-----------+----------+--------------+ PERO     Full                                                        +---------+---------------+---------+-----------+----------+--------------+     Summary: RIGHT: - There is no evidence of deep vein thrombosis in the lower extremity.  - No cystic structure found in the popliteal fossa.  LEFT: - There is no evidence of deep vein thrombosis in the lower extremity.  - No  cystic structure found in the popliteal fossa.  *See table(s) above for measurements and observations. Electronically signed by Fabienne Bruns MD on 10/28/2020 at 4:31:02 PM.    Final

## 2020-11-20 NOTE — Telephone Encounter (Signed)
Scheduled appointment per 06/30 los. Patient is aware. 

## 2020-12-02 ENCOUNTER — Ambulatory Visit: Payer: Self-pay | Admitting: Gastroenterology

## 2020-12-05 ENCOUNTER — Encounter: Payer: Self-pay | Admitting: Thoracic Surgery (Cardiothoracic Vascular Surgery)

## 2020-12-19 ENCOUNTER — Encounter: Payer: Self-pay | Admitting: Thoracic Surgery (Cardiothoracic Vascular Surgery)

## 2021-01-30 ENCOUNTER — Telehealth: Payer: Self-pay | Admitting: Hematology and Oncology

## 2021-01-30 NOTE — Telephone Encounter (Signed)
Rescheduled per provider. Called pt and left a msg 

## 2021-02-09 ENCOUNTER — Telehealth: Payer: Self-pay | Admitting: *Deleted

## 2021-02-09 NOTE — Telephone Encounter (Signed)
-----   Message from Artis Delay, MD sent at 02/09/2021  8:13 AM EDT ----- He is supposed to call and scehdule PET CT scan to be done on 9/28 Can you help him schedule PET for 9/28?

## 2021-02-09 NOTE — Telephone Encounter (Signed)
Per Dr.Gorsuch, called pt to help with PET scan scheduling. Left message. Awaiting call back.

## 2021-02-16 ENCOUNTER — Telehealth: Payer: Self-pay

## 2021-02-16 NOTE — Telephone Encounter (Signed)
-----   Message from Artis Delay, MD sent at 02/16/2021  7:52 AM EDT ----- I have requested PET CT scan to be done on 9/28 I did not see that being scheduled Can you call and see when it can be scheduled? Might have to move all his future appt

## 2021-02-16 NOTE — Telephone Encounter (Signed)
Called and left below message. Ask him to call the office back.   Rescheduled appts 10/7 8 am lab at [facility], then go to [facility] radiology at 0830, NPO 6 hours prior to PET scan. Dr. Bertis Ruddy appt moved to 10/10 at 1:20 pm. Left detailed message with the above appts.

## 2021-02-18 ENCOUNTER — Inpatient Hospital Stay: Payer: Self-pay

## 2021-02-20 ENCOUNTER — Ambulatory Visit: Payer: Self-pay | Admitting: Hematology and Oncology

## 2021-02-24 ENCOUNTER — Inpatient Hospital Stay: Payer: Self-pay | Admitting: Hematology and Oncology

## 2021-02-27 ENCOUNTER — Encounter: Payer: Self-pay | Admitting: Hematology and Oncology

## 2021-02-27 ENCOUNTER — Inpatient Hospital Stay: Payer: Self-pay | Attending: Hematology and Oncology

## 2021-02-27 ENCOUNTER — Ambulatory Visit (HOSPITAL_COMMUNITY): Admission: RE | Admit: 2021-02-27 | Payer: Self-pay | Source: Ambulatory Visit

## 2021-02-27 NOTE — Progress Notes (Signed)
The patient has been noncompliant with appointment and follow-up The patient did not show up for lab or PET CT scan today We are not able to get hold of the patient and multiple messages has been left on the patient's voicemail I will proceed to cancel his appointment next week No further attempt will be made to reschedule his appointment and follow-up

## 2021-03-02 ENCOUNTER — Inpatient Hospital Stay: Payer: Self-pay | Admitting: Hematology and Oncology

## 2021-03-03 ENCOUNTER — Ambulatory Visit: Payer: Self-pay | Admitting: Hematology and Oncology

## 2021-11-02 IMAGING — CT NM PET TUM IMG INITIAL (PI) SKULL BASE T - THIGH
7 series · 25 of 25 positions shown · non-contrast
Comparison: CT chest 10/28/2020.

CLINICAL DATA: Initial treatment strategy for mediastinal mass

EXAM:
NUCLEAR MEDICINE PET SKULL BASE TO THIGH
TECHNIQUE: 12.9 mCi F-18 FDG was injected intravenously. Full-ring PET imaging
was performed from the skull base to thigh after the radiotracer. CT
data was obtained and used for attenuation correction and anatomic
localization.
Fasting blood glucose: 104 mg/dl

[Series 3: pet sk_thigh ac · axial · 5.0mm · 4.07mm/px · z∈[-1230,-274]mm · 6 of 240 slices shown]
[im 1/240]
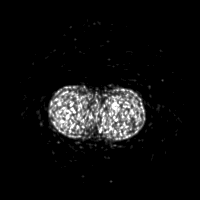
[im 48/240]
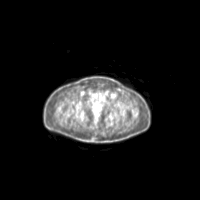
[im 96/240]
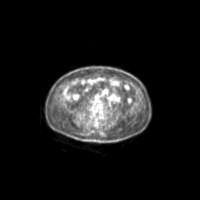
[im 144/240]
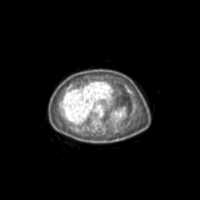
[im 192/240]
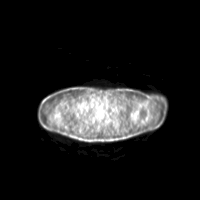
[im 240/240]
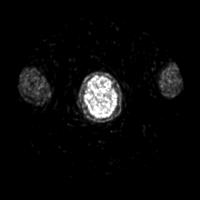

[Series 4: ct sk_thigh 5.0 bf37 · axial · 5.0mm · 0.98mm/px · z∈[-1230,-274]mm · 6 of 240 slices shown]
[im 1/240]
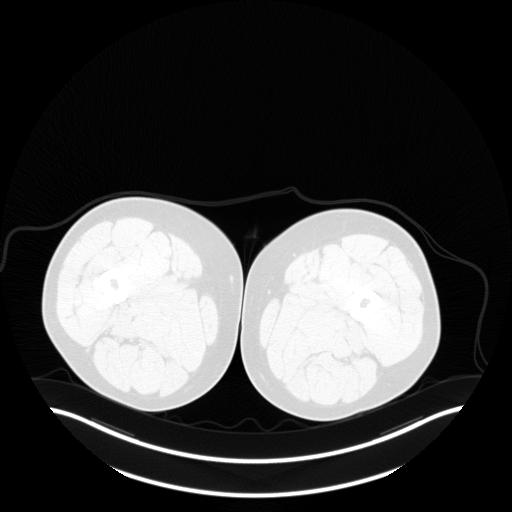
[im 48/240]
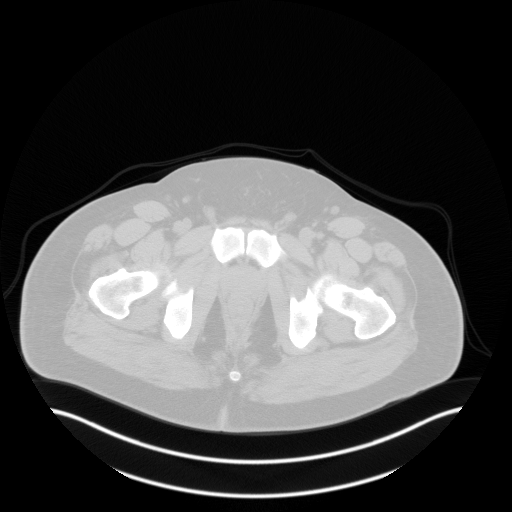
[im 96/240]
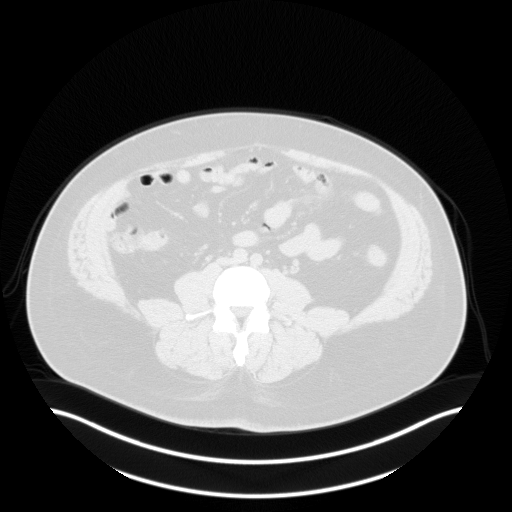
[im 144/240]
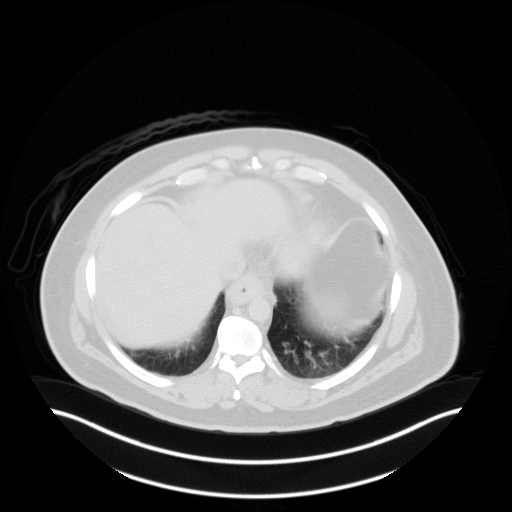
[im 192/240]
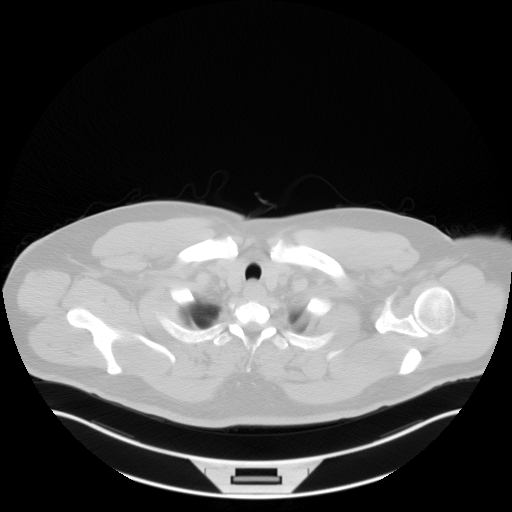
[im 240/240  brain]
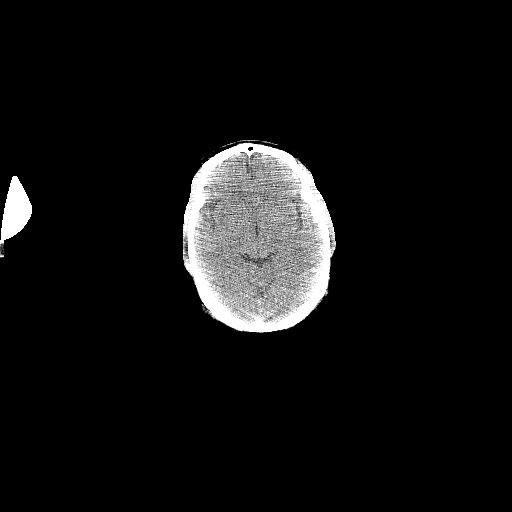

[Series 5: pet sk_thigh nac · axial · 5.0mm · 4.07mm/px · z∈[-1230,-274]mm · 5 of 240 slices shown]
[im 1/240]
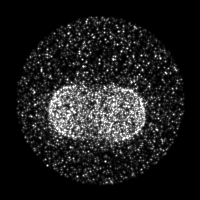
[im 60/240]
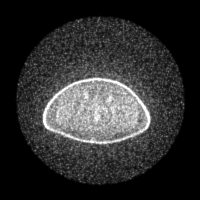
[im 120/240]
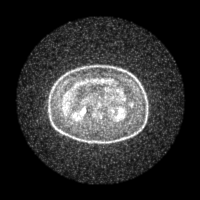
[im 180/240]
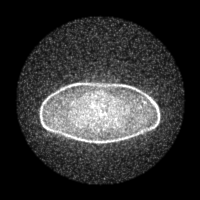
[im 240/240]
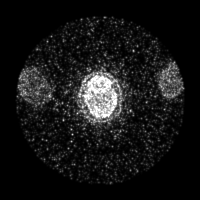

[Series 8: ct sk_thigh 5.0 ct sk_thigh br59 lung_bone · axial · 5.0mm · 0.71mm/px · 1 of 62 slices shown]
[im 1/62]
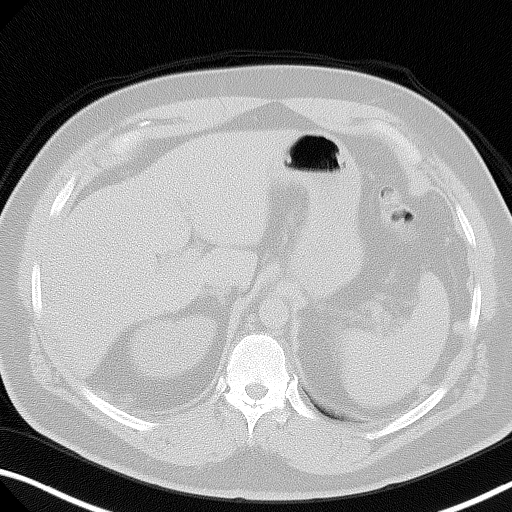

[Series 603: fused cor · 1 of 53 slices shown]
[im 1/53]
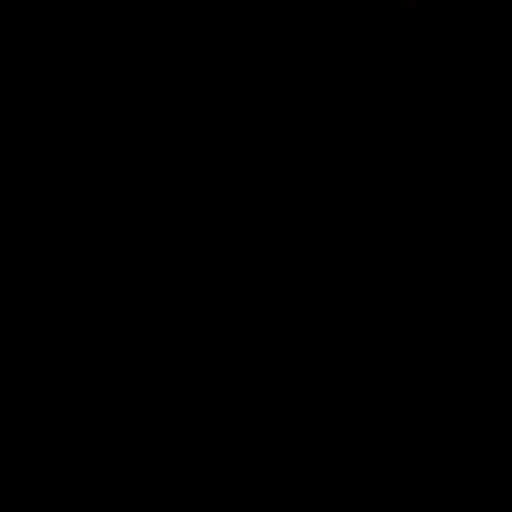

[Series 604: <mip collection> · coronal · 1.98mm/px · 1 of 32 slices shown]
[im 1/32]
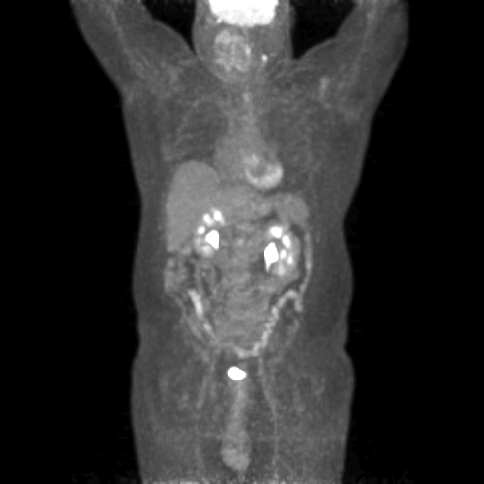

[Series 605: range-ct sk_thigh 5.0 bf37-tra-<alpha range> · 5 of 230 slices shown]
[im 1/230]
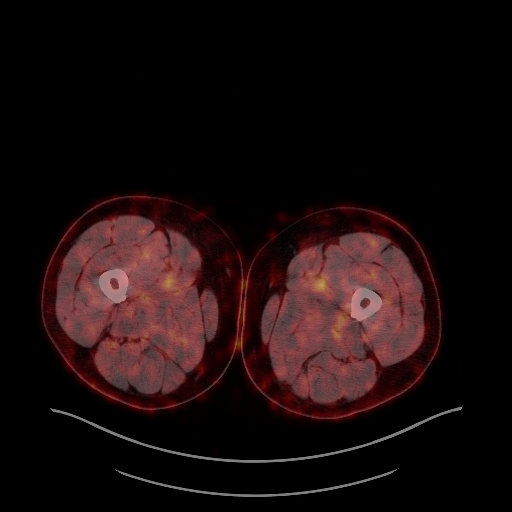
[im 58/230]
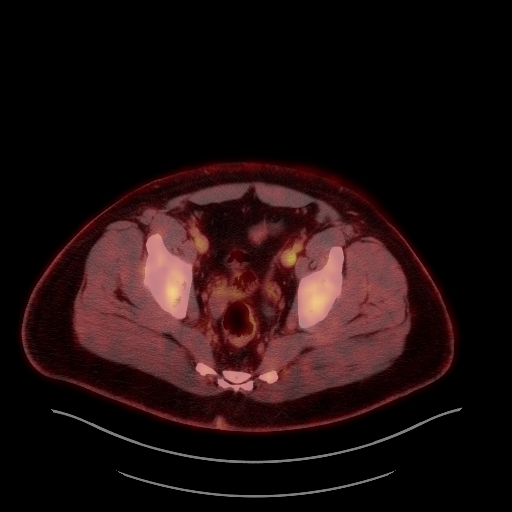
[im 115/230]
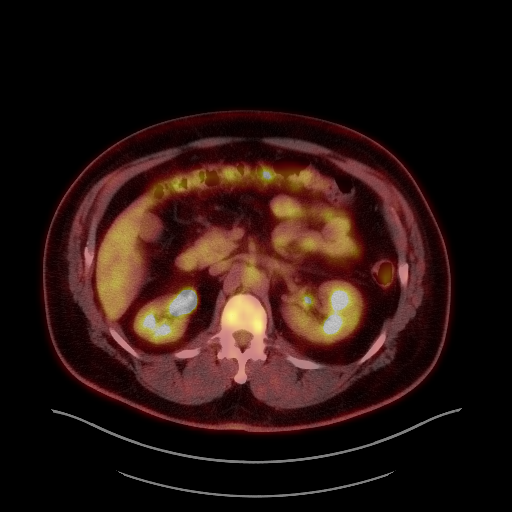
[im 172/230]
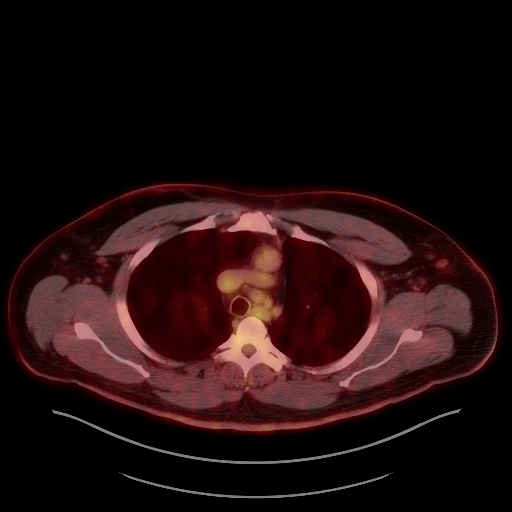
[im 230/230]
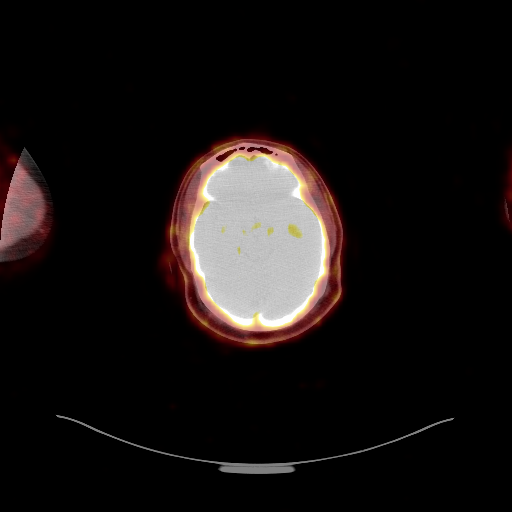

[25 of 25 positions shown; findings below may reference images not displayed]

FINDINGS: Mediastinal blood pool activity: SUV max

Liver activity: SUV max NA

NECK: Hypermetabolic LEFT level II lymph node measures 12 mm (image
31) with SUV max equal 8.0. Asymmetric hypermetabolic activity in
the LEFT lingual tonsil region with SUV max equal 6.7.

Incidental CT findings: none

CHEST: The anterior mediastinal mass measures 3.2 by 2.0 cm. The
mass is more dense than muscle slightly less dense than thyroid
tissue. Mass has very low metabolic activity (SUV max equal 1.9)
which is less than background blood pool activity. Mass is smoothly
marginated without evidence of invasion of mediastinal structures.

No hypermetabolic mediastinal nodes or hilar nodes.

No hypermetabolic activity associated the esophagus.

Incidental CT findings: Chronic elevation of the RIGHT hemidiaphragm
with associated atelectasis. Nodular density on image 79/4 is
favored an end on vessel. Mild RIGHT basilar atelectasis improved
from comparison CT.

ABDOMEN/PELVIS: No abnormal hypermetabolic activity within the
liver, pancreas, adrenal glands, or spleen. No hypermetabolic lymph
nodes in the abdomen or pelvis.

Incidental CT findings: none

SKELETON: No focal hypermetabolic activity to suggest skeletal
metastasis.

Incidental CT findings: none
IMPRESSION: 1. Very low metabolic activity associated with anterior mediastinal
mass. Differential includes thymoma versus germ cell tumor versus
rare ectopic thyroid tissue. Lymphoma would not be favored with very
low metabolic activity. Thymic carcinoma is also less favored.
2. Hypermetabolic LEFT level II lymph node and asymmetric
hypermetabolic activity in LEFT lingual tonsil region. This may be
of inflammatory process involving the hypopharynx. Recommend
clinical correlation and consider direct visualization as needed.
3. Improved inflammation the distal esophagus compared to CT
10/28/2020.

## 2022-12-09 ENCOUNTER — Ambulatory Visit: Payer: No Typology Code available for payment source | Admitting: Internal Medicine

## 2022-12-09 ENCOUNTER — Encounter: Payer: Self-pay | Admitting: Internal Medicine

## 2022-12-09 ENCOUNTER — Ambulatory Visit
Admission: RE | Admit: 2022-12-09 | Discharge: 2022-12-09 | Disposition: A | Discharge status: Home / Self Care | Payer: No Typology Code available for payment source | Source: Ambulatory Visit | Attending: Internal Medicine | Admitting: Internal Medicine

## 2022-12-09 VITALS — BP 172/100 | HR 93 | Temp 98.5°F | Ht 72.0 in | Wt 243.6 lb

## 2022-12-09 DIAGNOSIS — E663 Overweight: Secondary | ICD-10-CM

## 2022-12-09 DIAGNOSIS — R7989 Other specified abnormal findings of blood chemistry: Secondary | ICD-10-CM | POA: Diagnosis not present

## 2022-12-09 DIAGNOSIS — Z23 Encounter for immunization: Secondary | ICD-10-CM

## 2022-12-09 DIAGNOSIS — Z0000 Encounter for general adult medical examination without abnormal findings: Secondary | ICD-10-CM

## 2022-12-09 DIAGNOSIS — R638 Other symptoms and signs concerning food and fluid intake: Secondary | ICD-10-CM | POA: Diagnosis not present

## 2022-12-09 DIAGNOSIS — Z1211 Encounter for screening for malignant neoplasm of colon: Secondary | ICD-10-CM

## 2022-12-09 DIAGNOSIS — Z0001 Encounter for general adult medical examination with abnormal findings: Secondary | ICD-10-CM | POA: Diagnosis not present

## 2022-12-09 DIAGNOSIS — Z119 Encounter for screening for infectious and parasitic diseases, unspecified: Secondary | ICD-10-CM

## 2022-12-09 DIAGNOSIS — I158 Other secondary hypertension: Secondary | ICD-10-CM

## 2022-12-09 DIAGNOSIS — T7840XD Allergy, unspecified, subsequent encounter: Secondary | ICD-10-CM

## 2022-12-09 DIAGNOSIS — I159 Secondary hypertension, unspecified: Secondary | ICD-10-CM

## 2022-12-09 DIAGNOSIS — M79645 Pain in left finger(s): Secondary | ICD-10-CM

## 2022-12-09 DIAGNOSIS — G4733 Obstructive sleep apnea (adult) (pediatric): Secondary | ICD-10-CM | POA: Diagnosis not present

## 2022-12-09 DIAGNOSIS — J9859 Other diseases of mediastinum, not elsewhere classified: Secondary | ICD-10-CM

## 2022-12-09 DIAGNOSIS — R197 Diarrhea, unspecified: Secondary | ICD-10-CM

## 2022-12-09 DIAGNOSIS — K200 Eosinophilic esophagitis: Secondary | ICD-10-CM | POA: Diagnosis not present

## 2022-12-09 DIAGNOSIS — T7840XA Allergy, unspecified, initial encounter: Secondary | ICD-10-CM | POA: Insufficient documentation

## 2022-12-09 DIAGNOSIS — L918 Other hypertrophic disorders of the skin: Secondary | ICD-10-CM | POA: Insufficient documentation

## 2022-12-09 MED ORDER — LOSARTAN POTASSIUM 25 MG PO TABS: 25.0000 mg | ORAL_TABLET | Freq: Every day | ORAL | 3 refills | Status: DC

## 2022-12-09 MED ORDER — SHINGRIX 50 MCG/0.5ML IM SUSR
0.5000 mL | Freq: Once | INTRAMUSCULAR | 0 refills | Status: AC
Start: 2022-12-09 — End: 2022-12-09

## 2022-12-09 MED ORDER — AMLODIPINE BESYLATE 10 MG PO TABS
10.0000 mg | ORAL_TABLET | Freq: Every day | ORAL | 3 refills | Status: DC
Start: 2022-12-09 — End: 2023-12-16

## 2022-12-09 NOTE — Assessment & Plan Note (Addendum)
Eosinophilic Esophagitis:He shows no current symptoms of dysphagia or heartburn, with a history of successful esophageal dilation. Current management will continue.Based on our discussion this seems to be induced by grilled lamb chop and never recurred since he has avoided that.  I encouraged patient to continue avoiding.

## 2022-12-09 NOTE — Assessment & Plan Note (Signed)
We engaged in a comprehensive discussion regarding possibly mediastinal malignancy and follow up imaging and oncology consultation thoroughly exploring its potential benefits and associated risks. Despite my strong recommendation for this course of action, the patient expressed a preference to defer pursuing it at this time. We collaboratively explored the potential risks of alternative approaches, such as delaying the intervention or regular check-in physical. Throughout the discussion, the patient demonstrated comprehension of the potential consequences of not pursuing the recommended intervention, which could include death.  We documented this discussion in detail, including the patient's understanding of the risks and confirmation of their capacity to make this decision. We will continue to closely monitor the situation and schedule follow-up discussions to reassess their condition. In the meantime, I encouraged them to keep me updated on any developments.

## 2022-12-09 NOTE — Assessment & Plan Note (Signed)
Finger Pain:Suspected rheumatoid arthritis or Crest syndrome warrants ordering a hand x-ray and blood work for rheumatoid factor and lupus. A referral to a rheumatologist may be considered pending lab results.

## 2022-12-09 NOTE — Assessment & Plan Note (Signed)
Overweight:We discussed the need for weight loss or muscle gain to improve overall health and potentially ameliorate hypertension and sleep apnea. Encouragement for weight training and dietary changes was provided.

## 2022-12-09 NOTE — Patient Instructions (Addendum)
VISIT SUMMARY:  During your visit, we discussed several health concerns including your high blood pressure, eosinophilic esophagitis, weight, finger pain, diarrhea, allergies, and skin tags. We also discussed your general health maintenance.  YOUR PLAN:  -HIGH BLOOD PRESSURE: Your blood pressure is higher than normal, likely due to sleep apnea and being overweight. We will start you on blood pressure medication and gradually increase the dose to avoid dizziness. We also recommend reassessing the effectiveness of your CPAP machine and working on weight loss.  -EOSINOPHILIC ESOPHAGITIS: This is a condition that affects your esophagus, but currently, you are not experiencing any symptoms. We will continue with your current management plan.  -OVERWEIGHT: Losing weight or gaining muscle can improve your overall health and potentially reduce your high blood pressure and sleep apnea. We encourage you to start weight training and make dietary changes.  -FINGER PAIN: Your left ring finger pain could be due to rheumatoid arthritis or Crest syndrome. We will order a hand x-ray and blood work to check for these conditions. Depending on the results, we may refer you to a rheumatologist.  -DIARRHEA: Your recent diarrhea could be due to heat exposure. We recommend trying a BRAT diet or a FODMAP diet. If symptoms persist, we may consider a colonoscopy.  -ALLERGIES: Your severe sinus inflammation could be contributing to your mouth breathing and sleep apnea. We recommend daily sinus rinses and using Flonase.  -SKIN TAGS: We noticed some skin tags during your exam. We plan to remove these at a separate visit.  INSTRUCTIONS:  We will order a Colon Guard test for colon cancer screening. We also plan to discuss the potential for PSA testing for prostate cancer at a future visit. We recommend annual vision and biannual dental visits. We also advise safe practices including using smoke detectors, adhering to speed  limits, and using a helmet when necessary. We will order blood work to check for diabetes, thyroid issues, and cholesterol. We may consider weight loss medications at a future visit.Welcome aboard!   Today's visit was a valuable first step in understanding your health and starting your personalized care journey. We discussed your medical history and medications in detail. Given the extensive information, we prioritized addressing your most pressing concerns.  We understood those concerns to be:  New Patient (Initial Visit) (Discuss Shingles vaccine to make a decision.), Left finger pain (For about a  month. Unable to close the ring finger when trying to make a fist.), and Annual Exam   Building a Complete Picture  To create the most effective care plan possible, we may need additional information from previous providers. We encouraged you to gather any relevant medical records for your next visit. This will help [place] build a more complete picture and develop a personalized plan together. In the meantime, we'll address your immediate concerns and provide resources to help you manage all of your medical issues.  We encourage you to use MyChart to review these efforts, and to help [place] find and correct any omissions or errors in your medical chart.  Managing Your Health Over Time  Managing every aspect of your health in a single visit isn't always feasible, but that's okay.  We addressed your most pressing concerns today and charted a course for future care. Acute conditions or preventive care measures may require further attention.  We encourage you to schedule a follow-up visit at your earliest convenience to discuss any unresolved issues.  We strongly encourage participation in annual preventive care visits to help  [place] develop a more thorough understanding of your health and to help you maintain optimal wellness - please inquire about scheduling your next one with [place] at your earliest convenience.  Your  Satisfaction Matters  It was a pleasure seeing you today!  Your health and satisfaction will always be my top priorities. If you believe your experience today was worthy of a 5-star rating, I'd be grateful for your feedback!  Lula Olszewski, MD  Next Steps  Schedule Follow-Up:  We recommend a follow-up appointment in 1 year for your next wellness visit.  If you develop any new problems, want to address any medical issues, or your condition worsens before then, please call us for an appointment or seek emergency care. Preventive Care:  Don't forget to schedule your annual preventive care visit!  Please review your attached preventive care information. Make sure to arrange appointments for dental and vision routine screening, and use nightly nasal saline mist to keep your sinuses clear. Medical Information Release:  For any relevant medical information we don't have, please sign a release form at the front desk so we can obtain it for your records. Lab & X-ray Appointments:  Scheduled any incomplete lab tests today or call us to schedule.  X-Rays can be done without an appointment at [facility] at [facility] ([address], [address]), M-F 8:30am-noon or 1pm-5pm.  Just tell them you're there for X-rays ordered by Dr. Jon Billings.  We'll receive the results and contact you by phone or MyChart to discuss next steps.  Making the Most of Our Focused (20 minute) Appointments:  [x]   Clearly state your top concerns at the beginning of the visit to focus our discussion [x]   If you anticipate you will need more time, please inform the front desk during scheduling - we can book multiple appointments in the same week. [x]   If you have transportation problems- use our convenient video appointments or ask about transportation support. [x]   We can get down to business faster if you use MyChart to update information before the visit and submit non-urgent questions before your visit. Thank you for taking the  time to provide details through MyChart.  Let our nurse know and she can import this information into your encounter documents.  Arrival and Wait Times: [x]   Arriving on time ensures that everyone receives prompt attention. [x]   Early morning (8a) and afternoon (1p) appointments tend to have shortest wait times. [x]   Unfortunately, we cannot delay appointments for late arrivals or hold slots during phone calls.  Thank you for collaborating with [place] to prioritize your health. We look forward to serving you!  Bring to Your Next Appointment  Medications: Please bring all your medication bottles to your next appointment to ensure we have an accurate record of your prescriptions. Health Diaries: If you're monitoring any health conditions at home, keeping a diary of your readings can be very helpful for discussions at your next appointment.  Reviewing Your Records  Please Review this early draft of your clinical notes below and the final encounter summary tomorrow on MyChart after its been completed.   Screening for colon cancer -     Cologuard  Weight disorder Assessment & Plan: Overweight:We discussed the need for weight loss or muscle gain to improve overall health and potentially ameliorate hypertension and sleep apnea. Encouragement for weight training and dietary changes was provided.  Orders: -     Hemoglobin A1c; Future -     CBC with Differential/Platelet;  Future -     Comprehensive metabolic panel; Future -     TSH; Future -     Lipid panel; Future  Elevated d-dimer  Eosinophilic esophagitis Assessment & Plan: Eosinophilic Esophagitis:He shows no current symptoms of dysphagia or heartburn, with a history of successful esophageal dilation. Current management will continue.Based on our discussion this seems to be induced by grilled lamb chop and never recurred since he has avoided that.  I encouraged patient to continue avoiding.  Orders: -     Rheumatoid factor; Future -      ANA; Future -     Cyclic citrul peptide antibody, IgG; Future  Mediastinal mass Assessment & Plan: We engaged in a comprehensive discussion regarding possibly mediastinal malignancy and follow up imaging and oncology consultation thoroughly exploring its potential benefits and associated risks. Despite my strong recommendation for this course of action, the patient expressed a preference to defer pursuing it at this time. We collaboratively explored the potential risks of alternative approaches, such as delaying the intervention or regular check-in physical. Throughout the discussion, the patient demonstrated comprehension of the potential consequences of not pursuing the recommended intervention, which could include death.  We documented this discussion in detail, including the patient's understanding of the risks and confirmation of their capacity to make this decision. We will continue to closely monitor the situation and schedule follow-up discussions to reassess their condition. In the meantime, I encouraged them to keep me updated on any developments.   Orders: -     Rheumatoid factor; Future -     ANA; Future -     Cyclic citrul peptide antibody, IgG; Future -     Hemoglobin A1c; Future -     CBC with Differential/Platelet; Future -     Comprehensive metabolic panel; Future -     TSH; Future -     Lipid panel; Future  Finger pain, left Assessment & Plan: Finger Pain:Suspected rheumatoid arthritis or Crest syndrome warrants ordering a hand x-ray and blood work for rheumatoid factor and lupus. A referral to a rheumatologist may be considered pending lab results.  Orders: -     DG Hand Complete Left; Future -     Rheumatoid factor; Future -     ANA; Future -     Cyclic citrul peptide antibody, IgG; Future  Other secondary hypertension  OSA on CPAP  Secondary hypertension Assessment & Plan: Hypertension:Elevated blood pressure likely stems from sleep apnea and overweight status.  We will start blood pressure medication, titrating up as tolerated to avoid dizziness. Reassessment of CPAP machine effectiveness and weight loss is recommended.  Orders: -     amLODIPine Besylate; Take 1 tablet (10 mg total) by mouth daily. Start as 0.25 tablets daily for 1 week, then half tablet daily for 1 week  Dispense: 90 tablet; Refill: 3 -     Losartan Potassium; Take 1 tablet (25 mg total) by mouth daily.  Dispense: 90 tablet; Refill: 3  Encounter for annual general medical examination with abnormal findings in adult  Preventative health care -     HIV Antibody (routine testing w rflx); Future -     HCV RNA quant rflx ultra or genotyp; Future  Screening examination for infectious disease -     HIV Antibody (routine testing w rflx); Future -     HCV RNA quant rflx ultra or genotyp; Future  Need for shingles vaccine -     Shingrix; Inject 0.5 mLs into the muscle once  for 1 dose.  Dispense: 0.5 mL; Refill: 0  Diarrhea, unspecified type -     Hemoglobin A1c; Future -     CBC with Differential/Platelet; Future -     Comprehensive metabolic panel; Future -     TSH; Future -     Lipid panel; Future  Allergy, subsequent encounter  Skin tags, multiple acquired     Getting Answers and Following Up  Simple Questions & Concerns: For quick questions or basic follow-up after your visit, reach [place] at [phone] or MyChart messaging. Complex Concerns: If your concern is more complex, scheduling an appointment might be best. Discuss this with the staff to find the most suitable option. Lab & Imaging Results: We'll contact you directly if results are abnormal or you don't use MyChart. Most normal results will be on MyChart within 2-3 business days, with a review message from Dr. Jon Billings. Haven't heard back in 2 weeks? Need results sooner? Contact us at [phone]. Referrals: Our referral coordinator will manage specialist referrals. The specialist's office should contact you  within 2 weeks to schedule an appointment. Call us if you haven't heard from them after 2 weeks.  Staying Connected  MyChart: Activate your MyChart for the fastest way to access results and message [place]. See the last page of this paperwork for instructions on how to activate.  Billing  X-ray & Lab Orders: These are billed by separate companies. Contact the invoicing company directly for questions or concerns. Visit Charges: Discuss any billing inquiries with our administrative services team.  Feedback & Satisfaction  Share Your Experience: We strive for your satisfaction! If you have any complaints, or preferably compliments, please let Dr. Jon Billings know directly or contact our Practice Administrators, Edwena Felty or [employer], by asking at the front desk.   Scheduling Tips  Shorter Wait Times: 8 am and 1 pm appointments often have the quickest wait times. Longer Appointments: If you need more time during your visit, talk to the front desk. Due to insurance regulations, multiple back-to-back appointments might be necessary.

## 2022-12-09 NOTE — Progress Notes (Signed)
[facility]ult [facility]: [phone] Patient Care Team: Lula Olszewski, MD as PCP - General (Internal Medicine) Today's Healthcare Provider: Lula Olszewski, MD  Chief Complaint:  Jeffrey Glover is a 52 y.o. assigned male at birth who presents today for his New Patient (Initial Visit) (Discuss Shingles vaccine to make a decision.), Left finger pain (For about a  month. Unable to close the ring finger when trying to make a fist.), and Annual Exam  Assessment/Plan:   Screening for colon cancer -     Cologuard  Weight disorder Assessment & Plan: Overweight:We discussed the need for weight loss or muscle gain to improve overall health and potentially ameliorate hypertension and sleep apnea. Encouragement for weight training and dietary changes was provided.  Orders: -     Hemoglobin A1c; Future -     CBC with Differential/Platelet; Future -     Comprehensive metabolic panel; Future -     TSH; Future -     Lipid panel; Future  Elevated d-dimer  Eosinophilic esophagitis Assessment & Plan: Eosinophilic Esophagitis:He shows no current symptoms of dysphagia or heartburn, with a history of successful esophageal dilation. Current management will continue.Based on our discussion this seems to be induced by grilled lamb chop and never recurred since he has avoided that.  I encouraged patient to continue avoiding.  Orders: -     Rheumatoid factor; Future -     ANA; Future -     Cyclic citrul peptide antibody, IgG; Future  Mediastinal mass Assessment & Plan: We engaged in a comprehensive discussion regarding possibly mediastinal malignancy and follow up imaging and oncology consultation thoroughly exploring its potential benefits and associated risks. Despite my strong recommendation for this course of action, the patient expressed a preference to defer pursuing it at this time. We collaboratively explored the potential risks of alternative approaches, such as delaying the  intervention or regular check-in physical. Throughout the discussion, the patient demonstrated comprehension of the potential consequences of not pursuing the recommended intervention, which could include death.  We documented this discussion in detail, including the patient's understanding of the risks and confirmation of their capacity to make this decision. We will continue to closely monitor the situation and schedule follow-up discussions to reassess their condition. In the meantime, I encouraged them to keep me updated on any developments.   Orders: -     Rheumatoid factor; Future -     ANA; Future -     Cyclic citrul peptide antibody, IgG; Future -     Hemoglobin A1c; Future -     CBC with Differential/Platelet; Future -     Comprehensive metabolic panel; Future -     TSH; Future -     Lipid panel; Future  Finger pain, left Assessment & Plan: Finger Pain:Suspected rheumatoid arthritis or Crest syndrome warrants ordering a hand x-ray and blood work for rheumatoid factor and lupus. A referral to a rheumatologist may be considered pending lab results.  Orders: -     DG Hand Complete Left; Future -     Rheumatoid factor; Future -     ANA; Future -     Cyclic citrul peptide antibody, IgG; Future  Other secondary hypertension  OSA on CPAP  Secondary hypertension Assessment & Plan: Hypertension:Elevated blood pressure likely stems from sleep apnea and overweight status. We will start blood pressure medication, titrating up as tolerated to avoid dizziness. Reassessment of CPAP machine effectiveness and weight loss is recommended.  Orders: -  amLODIPine Besylate; Take 1 tablet (10 mg total) by mouth daily. Start as 0.25 tablets daily for 1 week, then half tablet daily for 1 week  Dispense: 90 tablet; Refill: 3 -     Losartan Potassium; Take 1 tablet (25 mg total) by mouth daily.  Dispense: 90 tablet; Refill: 3  Encounter for annual general medical examination with abnormal  findings in adult  Preventative health care -     HIV Antibody (routine testing w rflx); Future -     HCV RNA quant rflx ultra or genotyp; Future  Screening examination for infectious disease -     HIV Antibody (routine testing w rflx); Future -     HCV RNA quant rflx ultra or genotyp; Future  Need for shingles vaccine -     Shingrix; Inject 0.5 mLs into the muscle once for 1 dose.  Dispense: 0.5 mL; Refill: 0  Diarrhea, unspecified type -     Hemoglobin A1c; Future -     CBC with Differential/Platelet; Future -     Comprehensive metabolic panel; Future -     TSH; Future -     Lipid panel; Future  Allergy, subsequent encounter  Skin tags, multiple acquired   Medical Decision Making: 2 or more stable chronic illnesses 1 undiagnosed new problem with uncertain prognosis Prescription drug management      Today's Health Maintenance Counseling and Anticipatory Guidance:  Eye exams:  every 1-2 years recommended.  Having vision corrected can improve the quality of day-to-day life.  Eye specialists can detect certain eye conditions such as cataracts, glaucoma and age-related macular degeneration, which could lead to sight loss.  They can also detect certain rare cancers and diabetes, among other things.  Mr.Charlot reports his last eye exam was:  has not done this. Dental health: Discussed importance of regular tooth brushing, flossing, and dental visits q6 months.  Poor dentition can lead to serious medical problems - particularly problems with heart valves.  Mr.Weatherspoon reports he has not been keeping up with his dental visits.  He intends to do so in the future.  Sinus health: Encourage sterile saline nasal misting sinus rinses daily for pollen, to reduce allergies and risk for sinus infections.   Sterile can based misting products are recommended due to superior misting and ease of maintaining sterility. Sleep Apnea screening:  He reports obstructive sleep apnea autopap that isn't not  working mouthbreathing.  [facility]  Cardiovascular Risk Factor Reduction:   Advised patient of need for regular exercise and diet rich and fruits and vegetables and healthy fats to reduce risk of heart attack and stroke.  Avoid first- and second-hand smoke and stimulants.   Avoid extreme exercise- exercise in moderation (150 minutes per week is a good goal) Wt Readings from Last 3 Encounters:  12/09/22 243 lb 9.6 oz (110.5 kg)  11/20/20 247 lb 12.8 oz (112.4 kg)  10/29/20 229 lb 15 oz (104.3 kg)   Body mass index is 33.04 kg/m. /  He reports his diet consists of grilled food, plenty of vegetables  He reports his exercise includes of 3 evenings/week(s) at [employer] walking running Health maintenance and immunizations reviewed and he was encouraged to complete anything that is due: Immunization History  Administered Date(s) Administered   PFIZER(Purple Top)SARS-COV-2 Vaccination 08/15/2019, 09/05/2019   Health Maintenance Due  Topic Date Due   Hepatitis C Screening  Never done   Colonoscopy  Never done   Zoster Vaccines- Shingrix (1 of  2) Never done    Sexual transmitted infection screening: testing offered today, but patient declined as he feels he is low risk based on his sexual history    Substance use:  I discussed that my recommendation is total abstinence from all substances of abuse including smoke and 2nd hand smoke, alcohol, illicit drugs, smoking, inhalants, sugar.   Offered to assist with any use disorders or addictions.   Injury prevention: Discussed safety belts, safety helmets, smoke detectors. Cancer Screening: Penile cancer screening: Asked about genital warts or tumors/abnormalities of penis.Testicular cancer screening:  Patient was advised to palpate testicles, scrotum, penis for masses and inform me of any.   Thyroid cancer screening: patient advised to check by palpating thyroid for nodules Prostate cancer screening:  Denies  family history of prostate cancer or hematospermia so too young for screening by current guidelines. No results found for: "PSA"    he holds off for now. Colon cancer screening:    agreed to Cologuard initial screening. Lung cancer screening:  denies any smoking history. Skin cancer screening-  Advised regular sunscreen use. He denies worrisome, changing, or new skin lesions. Showed him pictures of melanomas for reference:  Preventive visit key points: General Health Maintenance: ColoGuard test for colon cancer screening will be ordered. Discussion on the potential for PSA testing for prostate cancer at a future visit is planned. Annual vision and biannual dental visits are recommended, along with daily sinus rinses and Flonase for allergy management. Safe practices including smoke detectors, speed limit adherence, and helmet use when necessary are advised. Blood work to check for diabetes, thyroid issues, and cholesterol is planned. Consideration of weight loss medications at a future visit is noted.     Return to care in 1 year for next preventative visit.     Subjective:   He has multiple acute complaints today but would like annual preventive visit too  History of Present Illness   The patient, a 6-foot tall individual with a history of eosinophilic esophagitis and elevated D-dimer, presented with concerns about a recent increase in weight. The patient reported no current issues with swallowing or heartburn, which were previously problematic. The patient has undergone three esophageal dilations, two of which were unsuccessful, but the third one alleviated the swallowing problem. The patient also reported a history of urticaria in 2022, but no recent flare-ups.  The patient reported a recent issue with the left ring finger, which has become difficult to close and causes pain when forced. This issue arose suddenly, with no preceding trauma or heavy hand work. The patient also reported recent  episodes of diarrhea, which he attributed to dietary changes and increased fluid intake due to heat exposure.  The patient has been managing his weight through regular exercise, including walking and running, and a diet primarily consisting of grilled food and vegetables. Despite these efforts, the patient acknowledged the need for further weight loss.  The patient has been using a CPAP machine for sleep apnea, but reported mouth breathing, which may be undermining the effectiveness of the treatment. The patient also reported high blood pressure, which was discovered during the current visit.  The patient denied any smoking history, skin cancer history, or lesions. The patient also denied any urinary tract symptoms and reported no family history of prostate cancer. The patient has been experiencing severe allergies, which have resulted in inflamed sinuses and may be contributing to the mouth breathing issue.  The patient has been managing his eosinophilic esophagitis and has not  had any recent issues with swallowing or heartburn. However, the patient reported a history of urticaria in 2022, but no recent flare-ups. The patient also reported a recent issue with the left ring finger, which has become difficult to close and causes pain when forced. This issue arose suddenly, with no preceding trauma or heavy hand work. The patient also reported recent episodes of diarrhea, which he attributed to dietary changes and increased fluid intake due to heat exposure.  The patient has been managing his weight through regular exercise, including walking and running, and a diet primarily consisting of grilled food and vegetables. Despite these efforts, the patient acknowledged the need for further weight loss. The patient has been using a CPAP machine for sleep apnea, but reported mouth breathing, which may be undermining the effectiveness of the treatment. The patient also reported high blood pressure, which was  discovered during the current visit.  The patient denied any smoking history, skin cancer history, or lesions. The patient also denied any urinary tract symptoms and reported no family history of prostate cancer. The patient has been experiencing severe allergies, which have resulted in inflamed sinuses and may be contributing to  the mouth breathing issue.      Review of Systems  Constitutional:  Negative for chills, diaphoresis, fever, malaise/fatigue and weight loss.  HENT:  Negative for congestion, ear discharge, ear pain, hearing loss, nosebleeds, sinus pain, sore throat and tinnitus.   Eyes:  Negative for blurred vision, double vision, photophobia, pain, discharge and redness.  Respiratory:  Negative for cough, hemoptysis, sputum production, shortness of breath, wheezing and stridor.   Cardiovascular:  Negative for chest pain, palpitations, orthopnea, claudication, leg swelling and PND.  Gastrointestinal:  Positive for diarrhea. Negative for abdominal pain, blood in stool, constipation, heartburn, melena, nausea and vomiting.  Genitourinary:  Negative for dysuria, flank pain, frequency, hematuria and urgency.  Musculoskeletal:  Positive for joint pain. Negative for back pain, falls, myalgias and neck pain.  Skin:  Negative for itching and rash.  Neurological:  Negative for dizziness, tingling, tremors, sensory change, speech change, focal weakness, seizures, loss of consciousness, weakness and headaches.  Endo/Heme/Allergies:  Negative for environmental allergies and polydipsia. Does not bruise/bleed easily.  Psychiatric/Behavioral:  Negative for depression, hallucinations, memory loss, substance abuse and suicidal ideas. The patient is not nervous/anxious and does not have insomnia.     During today's preventive visit, the patient has expressed that any positive findings in the review of systems are related to chronic conditions that are stable.  The patient has requested that these issues  not be addressed in this visit, with the understanding that they are part of ongoing management and do not require additional workup at this time.   Prior to collecting ROS, patient was informed that urgent issues will be addressed even if they require a separate billable visit that was not initially agreed upon, because reporting such issues can create medical liability if ignored.  I attest that I have reviewed and confirmed the patients current medications to meet the medication reconciliation requirement  Current Outpatient Medications  Medication Sig Dispense Refill   amLODipine (NORVASC) 10 MG tablet Take 1 tablet (10 mg total) by mouth daily. Start as 0.25 tablets daily for 1 week, then half tablet daily for 1 week 90 tablet 3   losartan (COZAAR) 25 MG tablet Take 1 tablet (25 mg total) by mouth daily. 90 tablet 3   SHINGRIX injection Inject 0.5 mLs into the muscle once for 1 dose.  0.5 mL 0   No current facility-administered medications for this visit.    The following were reviewed and entered/updated in epic:    12/09/2022   10:18 AM  Depression screen PHQ 2/9  Decreased Interest 0  Down, Depressed, Hopeless 0  PHQ - 2 Score 0   Past Medical History:  Diagnosis Date   Dysphagia    Dysuria 10/28/2020   Elevated d-dimer 10/28/2020   17 during severe esophagitis flare Hospitalization 10/2020     Eosinophilic esophagitis    Gastritis without bleeding    Sleep apnea    Urticaria 10/28/2020   Patient Active Problem List   Diagnosis Date Noted   Weight disorder 12/09/2022   Hypertension (secondary to OSA 12/09/2022   Finger pain, left 12/09/2022   OSA on CPAP 12/09/2022   Diarrhea 12/09/2022   Allergy 12/09/2022   Skin tags, multiple acquired 12/09/2022   Tonsil asymmetry 11/20/2020   Eosinophilic esophagitis    Epigastric pain 10/28/2020   Mediastinal mass 10/28/2020   Esophageal stricture    Past Surgical History:  Procedure Laterality Date   BIOPSY  10/29/2020    Procedure: BIOPSY;  Surgeon: Beverley Fiedler, MD;  Location: [facility];  Service: Gastroenterology;;   ESOPHAGEAL DILATION  10/29/2020   Procedure: ESOPHAGEAL DILATION;  Surgeon: Beverley Fiedler, MD;  Location: [facility];  Service: Gastroenterology;;   ESOPHAGOGASTRODUODENOSCOPY (EGD) WITH PROPOFOL N/A 08/30/2017   Procedure: ESOPHAGOGASTRODUODENOSCOPY (EGD) WITH PROPOFOL;  Surgeon: Sherrilyn Rist, MD;  Location: [facility];  Service: Gastroenterology;  Laterality: N/A;   ESOPHAGOGASTRODUODENOSCOPY (EGD) WITH PROPOFOL N/A 10/29/2020   Procedure: ESOPHAGOGASTRODUODENOSCOPY (EGD) WITH PROPOFOL;  Surgeon: Beverley Fiedler, MD;  Location: [facility];  Service: Gastroenterology;  Laterality: N/A;   UPPER GI ENDOSCOPY  08/03/2017   Family History  Problem Relation Age of Onset   Pancreatic cancer Maternal Grandmother    Allergies  Allergen Reactions   Other Anaphylaxis and Other (See Comments)    Fresh fish, can't breath   Social History   Tobacco Use   Smoking status: Never   Smokeless tobacco: Never  Vaping Use   Vaping status: Never Used  Substance Use Topics   Alcohol use: Yes    Alcohol/week: 3.0 - 4.0 standard drinks of alcohol    Types: 3 - 4 Cans of beer per week    Comment: occ   Drug use: Never         Objective:  Physical Exam: BP (!) 172/100 (BP Location: Left Arm, Patient Position: Sitting)   Pulse 93   Temp 98.5 F (36.9 C) (Temporal)   Ht 6' (1.829 m)   Wt 243 lb 9.6 oz (110.5 kg)   SpO2 96%   BMI 33.04 kg/m   Body mass index is 33.04 kg/m. Wt Readings from Last 3 Encounters:  12/09/22 243 lb 9.6 oz (110.5 kg)  11/20/20 247 lb 12.8 oz (112.4 kg)  10/29/20 229 lb 15 oz (104.3 kg)   GEN: NAD, resting comfortably truncal adiposity  HEENT: Tympanic membranes normal appearing bilaterally, Oropharynx clear. No Thyromegaly noted. No palpable lymphadenopathy or thyroid nodules. Marked sinus inflammation with pale boggy nasal mucosa CARDIOVASCULAR: S1 and S2  heart sounds have regular rate and rhythm with no murmurs appreciated PULMONARY:  Normal work of breathing. Clear to auscultation bilaterally with no crackles, wheezes, or rhonchi ABDOMEN: Soft, Nontender, Nondistended.  MSK: No edema, cyanosis, or clubbing noted , cannot fully close left ring finger without pain. SKIN: Warm, dry, no lesions of  concern observed skin tags noted NEURO: CN2-12 grossly intact. Strength 5/5 in upper and lower extremities. Reflexes symmetric and intact bilaterally.  PSYCH: Normal affect and thought content, pleasant and cooperative.

## 2022-12-09 NOTE — Assessment & Plan Note (Signed)
Hypertension:Elevated blood pressure likely stems from sleep apnea and overweight status. We will start blood pressure medication, titrating up as tolerated to avoid dizziness. Reassessment of CPAP machine effectiveness and weight loss is recommended.

## 2022-12-15 ENCOUNTER — Other Ambulatory Visit (INDEPENDENT_AMBULATORY_CARE_PROVIDER_SITE_OTHER): Payer: No Typology Code available for payment source

## 2022-12-15 DIAGNOSIS — R197 Diarrhea, unspecified: Secondary | ICD-10-CM

## 2022-12-15 DIAGNOSIS — R638 Other symptoms and signs concerning food and fluid intake: Secondary | ICD-10-CM

## 2022-12-15 DIAGNOSIS — Z0000 Encounter for general adult medical examination without abnormal findings: Secondary | ICD-10-CM

## 2022-12-15 DIAGNOSIS — Z119 Encounter for screening for infectious and parasitic diseases, unspecified: Secondary | ICD-10-CM

## 2022-12-15 DIAGNOSIS — J9859 Other diseases of mediastinum, not elsewhere classified: Secondary | ICD-10-CM

## 2022-12-15 DIAGNOSIS — M79645 Pain in left finger(s): Secondary | ICD-10-CM

## 2022-12-15 DIAGNOSIS — K200 Eosinophilic esophagitis: Secondary | ICD-10-CM

## 2022-12-15 LAB — LIPID PANEL
Cholesterol: 154 mg/dL (ref 0–200)
HDL: 58.3 mg/dL (ref >39.00)
LDL Cholesterol: 79 mg/dL (ref 0–99)
NonHDL: 95.54
Total CHOL/HDL Ratio: 3
Triglycerides: 84 mg/dL (ref 0.0–149.0)
VLDL: 16.8 mg/dL (ref 0.0–40.0)

## 2022-12-15 LAB — COMPREHENSIVE METABOLIC PANEL
ALT: 26 U/L (ref 0–53)
AST: 19 U/L (ref 0–37)
Albumin: 4 g/dL (ref 3.5–5.2)
Alkaline Phosphatase: 50 U/L (ref 39–117)
BUN: 10 mg/dL (ref 6–23)
CO2: 24 mEq/L (ref 19–32)
Calcium: 9.2 mg/dL (ref 8.4–10.5)
Chloride: 106 mEq/L (ref 96–112)
Creatinine, Ser: 1.02 mg/dL (ref 0.40–1.50)
GFR: 85.41 mL/min (ref >60.00)
Glucose, Bld: 136 mg/dL — ABNORMAL HIGH (ref 70–99)
Potassium: 4.1 mEq/L (ref 3.5–5.1)
Sodium: 138 mEq/L (ref 135–145)
Total Bilirubin: 0.4 mg/dL (ref 0.2–1.2)
Total Protein: 6.7 g/dL (ref 6.0–8.3)

## 2022-12-15 LAB — HEMOGLOBIN A1C: Hgb A1c MFr Bld: 6 % (ref 4.6–6.5)

## 2022-12-15 LAB — CBC WITH DIFFERENTIAL/PLATELET
Basophils Absolute: 0 10*3/uL (ref 0.0–0.1)
Basophils Relative: 0.8 % (ref 0.0–3.0)
Eosinophils Absolute: 0.6 10*3/uL (ref 0.0–0.7)
Eosinophils Relative: 9.9 % — ABNORMAL HIGH (ref 0.0–5.0)
HCT: 43 % (ref 39.0–52.0)
Hemoglobin: 13.8 g/dL (ref 13.0–17.0)
Lymphocytes Relative: 31.9 % (ref 12.0–46.0)
Lymphs Abs: 2 10*3/uL (ref 0.7–4.0)
MCHC: 32.1 g/dL (ref 30.0–36.0)
MCV: 89.9 fl (ref 78.0–100.0)
Monocytes Absolute: 0.4 10*3/uL (ref 0.1–1.0)
Monocytes Relative: 7 % (ref 3.0–12.0)
Neutro Abs: 3.2 10*3/uL (ref 1.4–7.7)
Neutrophils Relative %: 50.4 % (ref 43.0–77.0)
Platelets: 282 10*3/uL (ref 150.0–400.0)
RBC: 4.79 Mil/uL (ref 4.22–5.81)
RDW: 14.2 % (ref 11.5–15.5)
WBC: 6.3 10*3/uL (ref 4.0–10.5)

## 2022-12-15 LAB — TSH: TSH: 1.07 u[IU]/mL (ref 0.35–5.50)

## 2022-12-16 LAB — HIV ANTIBODY (ROUTINE TESTING W REFLEX): HIV 1&2 Ab, 4th Generation: NONREACTIVE

## 2022-12-16 LAB — ANA: Anti Nuclear Antibody (ANA): NEGATIVE

## 2022-12-16 LAB — RHEUMATOID FACTOR: Rheumatoid fact SerPl-aCnc: <10 IU/mL (ref <14)

## 2022-12-16 NOTE — Progress Notes (Signed)
Doesn't explain ring finger pain but finds osteoarthritis

## 2022-12-27 ENCOUNTER — Encounter: Payer: Self-pay | Admitting: Internal Medicine

## 2022-12-29 ENCOUNTER — Other Ambulatory Visit: Payer: Self-pay

## 2022-12-29 ENCOUNTER — Encounter: Payer: Self-pay | Admitting: Internal Medicine

## 2022-12-29 DIAGNOSIS — M79645 Pain in left finger(s): Secondary | ICD-10-CM

## 2022-12-30 ENCOUNTER — Other Ambulatory Visit: Payer: Self-pay | Admitting: Internal Medicine

## 2022-12-30 DIAGNOSIS — R638 Other symptoms and signs concerning food and fluid intake: Secondary | ICD-10-CM

## 2022-12-30 MED ORDER — BUPROPION HCL ER (XL) 150 MG PO TB24: 150.0000 mg | ORAL_TABLET | Freq: Every morning | ORAL | 3 refills | Status: DC

## 2022-12-30 MED ORDER — METFORMIN HCL 500 MG PO TABS
500.0000 mg | ORAL_TABLET | Freq: Two times a day (BID) | ORAL | 3 refills | Status: DC
Start: 2022-12-30 — End: 2023-12-16

## 2022-12-30 MED ORDER — TOPIRAMATE 25 MG PO TABS: 25.0000 mg | ORAL_TABLET | Freq: Two times a day (BID) | ORAL | 3 refills | Status: DC

## 2022-12-30 NOTE — Progress Notes (Unsigned)
Patient request affordable weight loss medications, advised patient to read and try them 1 at a time and sent.

## 2023-01-10 DIAGNOSIS — M65342 Trigger finger, left ring finger: Secondary | ICD-10-CM | POA: Insufficient documentation

## 2023-01-28 NOTE — Progress Notes (Signed)
Key Points for Patient Follow-Up Call:  Test Results Summary:    Test: Cologuard (stool DNA test for colorectal cancer screening)    Result: Negative    Significance: 99.94% certainty of no colorectal cancer present  Clinical Interpretation:    Negative result is reassuring but not a 100% guarantee    Consistent with current health status and absence of reported symptoms  Patient Instructions:    No immediate actions required based on this result    Continue current lifestyle habits, emphasizing healthy diet and regular exercise    Report any new digestive symptoms or changes in bowel habits promptly  Follow-up Plan:    Next Cologuard test due in 3 years (adjust if high-risk family history)    Routine check-ups as previously scheduled  Addressing Patient Concerns: If patient asks about other screening options: "While Cologuard is highly effective, Dr. Jon Billings can discuss alternative screening methods like colonoscopy at your next visit if you're interested." If patient inquires about earlier rescreening: "The 3-year interval is based on current guidelines. However, if you have concerns or new symptoms, please let us know immediately."  Next Steps: Confirm patient understands the result and follow-up plan Offer to schedule next routine appointment if needed Remind patient to reach out with any new concerns before next scheduled screening  Note: Patient education on maintaining colorectal health through diet, exercise, and prompt reporting of symptoms is crucial. Your role in reinforcing these points is greatly appreciated.

## 2023-03-17 ENCOUNTER — Encounter (INDEPENDENT_AMBULATORY_CARE_PROVIDER_SITE_OTHER): Payer: No Typology Code available for payment source | Admitting: Internal Medicine

## 2023-03-17 DIAGNOSIS — R197 Diarrhea, unspecified: Secondary | ICD-10-CM

## 2023-03-17 DIAGNOSIS — R638 Other symptoms and signs concerning food and fluid intake: Secondary | ICD-10-CM

## 2023-03-22 DIAGNOSIS — R638 Other symptoms and signs concerning food and fluid intake: Secondary | ICD-10-CM | POA: Diagnosis not present

## 2023-03-22 DIAGNOSIS — R197 Diarrhea, unspecified: Secondary | ICD-10-CM

## 2023-03-22 NOTE — Telephone Encounter (Signed)
MyChart Encounter Documentation Date: 03/22/2023  ENCOUNTER TYPE MyChart secure digital messaging clinical encounter  CHIEF COMPLAINT Request for alternative weight loss medication due to GI side effects; inquiry about Wegovy  RELEVANT HISTORY - 52 year old male with BMI 33.04 kg/m - Recent history of diarrhea attributed to dietary changes - History of eosinophilic esophagitis - Current medications include amlodipine, bupropion, losartan, metformin, and topiramate - Weight trends show minimal change: 243 lb (12/09/22) vs 247 lb (11/20/20)  ASSESSMENT 1. Weight disorder (BMI 33.04) with reported medication intolerance 2. Recent diarrhea requiring evaluation before new weight loss medication initiation 3. Complex medical history requiring careful medication selection  PLAN 1. Recommended in-person evaluation before initiating new weight loss medication 2. Need to obtain:    - Updated comprehensive metabolic panel    - TSH    - HbA1C 3. Will consider Wegovy (semaglutide) pending:    - Insurance coverage verification    - Evaluation of contraindications    - Assessment of GI symptom stability 4. Reference: Current obesity guidelines ([employer])  PATIENT EDUCATION - Provided information about Wegovy and potential side effects - Discussed importance of lifestyle modifications - Reviewed warning signs requiring emergency care - Emphasized need for careful monitoring with medication changes  MEDICAL DECISION MAKING Moderate complexity due to: - Multiple medical conditions affecting medication choice - Need to coordinate care with recent GI symptoms - Requirement for careful benefit-risk assessment - Need for laboratory monitoring  Please see the MyChart message reply(ies) for my assessment and plan.  This patient gave consent for this Medical Advice Message and is aware that it may result in a bill to [employer], as well as the  possibility of receiving a bill for a co-payment or deductible. They are an established patient, but are not seeking medical advice exclusively about a problem treated during an in-person or video visit in the last seven days. I did not recommend an in-person or video visit within seven days of my reply.  I spent a total of 8 minutes cumulative time within 7 days through [employer].  Lula Olszewski, MD

## 2023-05-03 DIAGNOSIS — Z630 Problems in relationship with spouse or partner: Secondary | ICD-10-CM | POA: Diagnosis not present

## 2023-05-03 DIAGNOSIS — F411 Generalized anxiety disorder: Secondary | ICD-10-CM | POA: Diagnosis not present

## 2023-05-31 ENCOUNTER — Encounter: Payer: Self-pay | Admitting: Internal Medicine

## 2023-06-01 ENCOUNTER — Ambulatory Visit
Admission: RE | Admit: 2023-06-01 | Discharge: 2023-06-01 | Disposition: A | Discharge status: Home / Self Care | Payer: BC Managed Care – PPO | Source: Ambulatory Visit | Attending: Family Medicine | Admitting: Family Medicine

## 2023-06-01 VITALS — BP 182/107 | HR 78 | Temp 98.7°F | Resp 18

## 2023-06-01 DIAGNOSIS — J189 Pneumonia, unspecified organism: Secondary | ICD-10-CM | POA: Diagnosis not present

## 2023-06-01 MED ORDER — AZITHROMYCIN 250 MG PO TABS: ORAL_TABLET | ORAL | 0 refills | Status: DC

## 2023-06-01 MED ORDER — ACETAMINOPHEN 325 MG PO TABS: 650.0000 mg | ORAL_TABLET | Freq: Four times a day (QID) | ORAL | 0 refills | Status: DC | PRN

## 2023-06-01 MED ORDER — CETIRIZINE HCL 10 MG PO TABS: 10.0000 mg | ORAL_TABLET | Freq: Every day | ORAL | 0 refills | Status: DC

## 2023-06-01 MED ORDER — AMOXICILLIN-POT CLAVULANATE 875-125 MG PO TABS: 1.0000 | ORAL_TABLET | Freq: Two times a day (BID) | ORAL | 0 refills | Status: DC

## 2023-06-01 MED ORDER — PROMETHAZINE-DM 6.25-15 MG/5ML PO SYRP: 5.0000 mL | ORAL_SOLUTION | Freq: Every evening | ORAL | 0 refills | Status: DC | PRN

## 2023-06-01 NOTE — ED Provider Notes (Signed)
 [facility]  Note:  This document was prepared using Conservation officer, historic buildings and may include unintentional dictation errors.  MRN: [id number] DOB: Sep 24, 1970  Subjective:   Jeffrey Glover is a 53 y.o. male presenting for 3-week history of worsening coughing, chest congestion, sinus congestion, chest discomfort.  Patient initially started with the flu and his symptoms have lingered and persisted to today.  No history of asthma.  No smoking of any kind including cigarettes, cigars, vaping, marijuana use.    No current facility-administered medications for this encounter.  Current Outpatient Medications:    amLODipine  (NORVASC ) 10 MG tablet, Take 1 tablet (10 mg total) by mouth daily. Start as 0.25 tablets daily for 1 week, then half tablet daily for 1 week, Disp: 90 tablet, Rfl: 3   buPROPion  (WELLBUTRIN  XL) 150 MG 24 hr tablet, Take 1 tablet (150 mg total) by mouth in the morning., Disp: 90 tablet, Rfl: 3   losartan  (COZAAR ) 25 MG tablet, Take 1 tablet (25 mg total) by mouth daily., Disp: 90 tablet, Rfl: 3   metFORMIN  (GLUCOPHAGE ) 500 MG tablet, Take 1 tablet (500 mg total) by mouth 2 (two) times daily with a meal. To start: just half a tablet daily for 2 weeks., Disp: 180 tablet, Rfl: 3   topiramate  (TOPAMAX ) 25 MG tablet, Take 1 tablet (25 mg total) by mouth 2 (two) times daily. To start: just 1 tablet daily for a week, Disp: 180 tablet, Rfl: 3   Allergies  Allergen Reactions   Other Anaphylaxis and Other (See Comments)    Fresh fish, can't breath    Past Medical History:  Diagnosis Date   Dysphagia    Dysuria 10/28/2020   Elevated d-dimer 10/28/2020   17 during severe esophagitis flare Hospitalization 10/2020     Eosinophilic esophagitis    Gastritis without bleeding    Sleep apnea    Urticaria 10/28/2020     Past Surgical History:  Procedure Laterality Date   BIOPSY  10/29/2020   Procedure: BIOPSY;  Surgeon: Albertus Gordy HERO, MD;  Location: [facility];  Service: Gastroenterology;;   ESOPHAGEAL DILATION  10/29/2020   Procedure: ESOPHAGEAL DILATION;  Surgeon: Albertus Gordy HERO, MD;  Location: [facility];  Service: Gastroenterology;;   ESOPHAGOGASTRODUODENOSCOPY (EGD) WITH PROPOFOL  N/A 08/30/2017   Procedure: ESOPHAGOGASTRODUODENOSCOPY (EGD) WITH PROPOFOL ;  Surgeon: Legrand Victory LITTIE DOUGLAS, MD;  Location: [facility];  Service: Gastroenterology;  Laterality: N/A;   ESOPHAGOGASTRODUODENOSCOPY (EGD) WITH PROPOFOL  N/A 10/29/2020   Procedure: ESOPHAGOGASTRODUODENOSCOPY (EGD) WITH PROPOFOL ;  Surgeon: Albertus Gordy HERO, MD;  Location: [facility];  Service: Gastroenterology;  Laterality: N/A;   UPPER GI ENDOSCOPY  08/03/2017    Family History  Problem Relation Age of Onset   Pancreatic cancer Maternal Grandmother     Social History   Tobacco Use   Smoking status: Never   Smokeless tobacco: Never  Vaping Use   Vaping status: Never Used  Substance Use Topics   Alcohol use: Yes    Alcohol/week: 3.0 - 4.0 standard drinks of alcohol    Types: 3 - 4 Cans of beer per week    Comment: occ   Drug use: Never    ROS   Objective:   Vitals: BP (!) 182/107 (BP Location: Left Arm)   Pulse 78   Temp 98.7 F (37.1 C) (Oral)   Resp 18   SpO2 94%   Physical Exam Constitutional:      General: He is not in acute distress.  Appearance: Normal appearance. He is well-developed and normal weight. He is not ill-appearing, toxic-appearing or diaphoretic.  HENT:     Head: Normocephalic and atraumatic.     Right Ear: External ear normal.     Left Ear: External ear normal.     Nose: Nose normal.     Mouth/Throat:     Mouth: Mucous membranes are moist.     Pharynx: Oropharynx is clear.  Eyes:     General: No scleral icterus.       Right eye: No discharge.        Left eye: No discharge.     Extraocular Movements: Extraocular movements intact.  Cardiovascular:     Rate and Rhythm: Normal rate and regular rhythm.     Heart sounds: Normal heart  sounds. No murmur heard.    No friction rub. No gallop.  Pulmonary:     Effort: Pulmonary effort is normal. No respiratory distress.     Breath sounds: No stridor. Examination of the left-middle field reveals rales. Examination of the left-lower field reveals rales. Rales present. No wheezing or rhonchi.  Musculoskeletal:     Cervical back: Normal range of motion.  Neurological:     Mental Status: He is alert and oriented to person, place, and time.  Psychiatric:        Mood and Affect: Mood normal.        Behavior: Behavior normal.        Thought Content: Thought content normal.        Judgment: Judgment normal.     Assessment and Plan :   PDMP not reviewed this encounter.  1. Pneumonia of left lower lobe due to infectious organism    Recommend empiric treatment for pneumonia of the left mid to lower lobe given physical exam findings.  Start azithromycin  and Augmentin  for double coverage.  Use supportive care otherwise.  Will defer imaging for now.  Counseled patient on potential for adverse effects with medications prescribed/recommended today, ER and return-to-clinic precautions discussed, patient verbalized understanding.    Christopher Savannah, PA-C 06/01/23 1524

## 2023-06-01 NOTE — ED Triage Notes (Signed)
 Pt reports cough and congestion x 3 weeks after the Flu. Pt not taking any meds for complaints.

## 2023-06-07 DIAGNOSIS — Z630 Problems in relationship with spouse or partner: Secondary | ICD-10-CM | POA: Diagnosis not present

## 2023-06-07 DIAGNOSIS — F411 Generalized anxiety disorder: Secondary | ICD-10-CM | POA: Diagnosis not present

## 2023-06-14 DIAGNOSIS — F4323 Adjustment disorder with mixed anxiety and depressed mood: Secondary | ICD-10-CM | POA: Diagnosis not present

## 2023-06-14 DIAGNOSIS — G4733 Obstructive sleep apnea (adult) (pediatric): Secondary | ICD-10-CM | POA: Diagnosis not present

## 2023-06-21 DIAGNOSIS — F411 Generalized anxiety disorder: Secondary | ICD-10-CM | POA: Diagnosis not present

## 2023-06-21 DIAGNOSIS — Z630 Problems in relationship with spouse or partner: Secondary | ICD-10-CM | POA: Diagnosis not present

## 2023-07-12 DIAGNOSIS — Z630 Problems in relationship with spouse or partner: Secondary | ICD-10-CM | POA: Diagnosis not present

## 2023-07-12 DIAGNOSIS — F411 Generalized anxiety disorder: Secondary | ICD-10-CM | POA: Diagnosis not present

## 2023-07-15 DIAGNOSIS — G4733 Obstructive sleep apnea (adult) (pediatric): Secondary | ICD-10-CM | POA: Diagnosis not present

## 2023-07-19 DIAGNOSIS — F4323 Adjustment disorder with mixed anxiety and depressed mood: Secondary | ICD-10-CM | POA: Diagnosis not present

## 2023-08-02 DIAGNOSIS — F411 Generalized anxiety disorder: Secondary | ICD-10-CM | POA: Diagnosis not present

## 2023-08-02 DIAGNOSIS — Z630 Problems in relationship with spouse or partner: Secondary | ICD-10-CM | POA: Diagnosis not present

## 2023-08-09 DIAGNOSIS — F4323 Adjustment disorder with mixed anxiety and depressed mood: Secondary | ICD-10-CM | POA: Diagnosis not present

## 2023-08-12 DIAGNOSIS — G4733 Obstructive sleep apnea (adult) (pediatric): Secondary | ICD-10-CM | POA: Diagnosis not present

## 2023-09-06 DIAGNOSIS — F4323 Adjustment disorder with mixed anxiety and depressed mood: Secondary | ICD-10-CM | POA: Diagnosis not present

## 2023-09-27 DIAGNOSIS — F4323 Adjustment disorder with mixed anxiety and depressed mood: Secondary | ICD-10-CM | POA: Diagnosis not present

## 2023-11-08 DIAGNOSIS — F4323 Adjustment disorder with mixed anxiety and depressed mood: Secondary | ICD-10-CM | POA: Diagnosis not present

## 2023-12-13 DIAGNOSIS — G4733 Obstructive sleep apnea (adult) (pediatric): Secondary | ICD-10-CM | POA: Diagnosis not present

## 2023-12-16 ENCOUNTER — Encounter: Payer: Self-pay | Admitting: Internal Medicine

## 2023-12-16 ENCOUNTER — Ambulatory Visit (INDEPENDENT_AMBULATORY_CARE_PROVIDER_SITE_OTHER): Payer: No Typology Code available for payment source | Admitting: Internal Medicine

## 2023-12-16 VITALS — BP 146/80 | HR 84 | Temp 98.6°F | Ht 72.0 in | Wt 240.0 lb

## 2023-12-16 DIAGNOSIS — G4733 Obstructive sleep apnea (adult) (pediatric): Secondary | ICD-10-CM | POA: Diagnosis not present

## 2023-12-16 DIAGNOSIS — L570 Actinic keratosis: Secondary | ICD-10-CM

## 2023-12-16 DIAGNOSIS — Z125 Encounter for screening for malignant neoplasm of prostate: Secondary | ICD-10-CM | POA: Diagnosis not present

## 2023-12-16 DIAGNOSIS — Z0001 Encounter for general adult medical examination with abnormal findings: Secondary | ICD-10-CM | POA: Diagnosis not present

## 2023-12-16 DIAGNOSIS — R7303 Prediabetes: Secondary | ICD-10-CM | POA: Diagnosis not present

## 2023-12-16 DIAGNOSIS — R638 Other symptoms and signs concerning food and fluid intake: Secondary | ICD-10-CM

## 2023-12-16 DIAGNOSIS — I159 Secondary hypertension, unspecified: Secondary | ICD-10-CM

## 2023-12-16 DIAGNOSIS — M79671 Pain in right foot: Secondary | ICD-10-CM

## 2023-12-16 DIAGNOSIS — K200 Eosinophilic esophagitis: Secondary | ICD-10-CM

## 2023-12-16 LAB — CBC WITH DIFFERENTIAL/PLATELET
Basophils Absolute: 0 K/uL (ref 0.0–0.1)
Basophils Relative: 0.7 % (ref 0.0–3.0)
Eosinophils Absolute: 0.7 K/uL (ref 0.0–0.7)
Eosinophils Relative: 11.1 % — ABNORMAL HIGH (ref 0.0–5.0)
HCT: 44.8 % (ref 39.0–52.0)
Hemoglobin: 14.7 g/dL (ref 13.0–17.0)
Lymphocytes Relative: 23.7 % (ref 12.0–46.0)
Lymphs Abs: 1.6 K/uL (ref 0.7–4.0)
MCHC: 32.7 g/dL (ref 30.0–36.0)
MCV: 90.1 fl (ref 78.0–100.0)
Monocytes Absolute: 0.5 K/uL (ref 0.1–1.0)
Monocytes Relative: 6.8 % (ref 3.0–12.0)
Neutro Abs: 3.9 K/uL (ref 1.4–7.7)
Neutrophils Relative %: 57.7 % (ref 43.0–77.0)
Platelets: 270 K/uL (ref 150.0–400.0)
RBC: 4.97 Mil/uL (ref 4.22–5.81)
RDW: 14.1 % (ref 11.5–15.5)
WBC: 6.7 K/uL (ref 4.0–10.5)

## 2023-12-16 LAB — LIPID PANEL
Cholesterol: 174 mg/dL (ref 0–200)
HDL: 61.5 mg/dL (ref >39.00)
LDL Cholesterol: 92 mg/dL (ref 0–99)
NonHDL: 112
Total CHOL/HDL Ratio: 3
Triglycerides: 98 mg/dL (ref 0.0–149.0)
VLDL: 19.6 mg/dL (ref 0.0–40.0)

## 2023-12-16 LAB — COMPREHENSIVE METABOLIC PANEL WITH GFR
ALT: 29 U/L (ref 0–53)
AST: 19 U/L (ref 0–37)
Albumin: 4.2 g/dL (ref 3.5–5.2)
Alkaline Phosphatase: 53 U/L (ref 39–117)
BUN: 10 mg/dL (ref 6–23)
CO2: 26 meq/L (ref 19–32)
Calcium: 9.4 mg/dL (ref 8.4–10.5)
Chloride: 105 meq/L (ref 96–112)
Creatinine, Ser: 0.86 mg/dL (ref 0.40–1.50)
GFR: 99.91 mL/min (ref >60.00)
Glucose, Bld: 96 mg/dL (ref 70–99)
Potassium: 4.4 meq/L (ref 3.5–5.1)
Sodium: 138 meq/L (ref 135–145)
Total Bilirubin: 0.4 mg/dL (ref 0.2–1.2)
Total Protein: 6.8 g/dL (ref 6.0–8.3)

## 2023-12-16 LAB — HEMOGLOBIN A1C: Hgb A1c MFr Bld: 6.2 % (ref 4.6–6.5)

## 2023-12-16 LAB — PSA: PSA: 1.55 ng/mL (ref 0.10–4.00)

## 2023-12-16 MED ORDER — MINOXIDIL 2.5 MG PO TABS: 2.5000 mg | ORAL_TABLET | Freq: Every day | ORAL | 3 refills | Status: DC

## 2023-12-16 MED ORDER — TIRZEPATIDE-WEIGHT MANAGEMENT 2.5 MG/0.5ML ~~LOC~~ SOAJ: 2.5000 mg | SUBCUTANEOUS | 11 refills | Status: DC

## 2023-12-16 NOTE — Patient Instructions (Signed)
 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 Since the episode in April 2024, you have not experienced any recurrence of symptoms and have been healthy without any fevers. You are not currently on any specific medication for meningitis or encephalitis and have not had any further episodes.  YOUR PLAN:  -ASEPTIC MENINGITIS AND ENCEPHALITIS: Aseptic meningitis and encephalitis are inflammations of the protective membranes covering the brain and spinal cord, usually caused by a viral infection. Your episode in April 2024 was not serious or life-threatening and is unlikely to recur. You are healthy and not at risk for recurrence. I have provided reassurance with a 99.999% confidence that recurrence is unlikely. I will also provide a letter stating that the meningitis and encephalitis was a one-time event and is unlikely to affect your future health. I recommend using Simply Saline nasal spray to reduce the risk of sinus infections and manage allergies. Using the nasal spray before Flonase  can enhance its effectiveness.  INSTRUCTIONS:  No follow-up is necessary unless you experience any new symptoms or health concerns. Continue to use Simply Saline nasal spray as recommended.    ALLERGY MANAGEMENT PLAN  This plan is designed to help manage your allergic rhinitis (nasal allergies) effectively. Follow these steps daily for best results.  Sinus saline sprays- use nightly, and after sneezing episodes or exposure to allergen.  Insert deeply and spray mist into nose while leaning over sink at 45 degrees,  while gently breathing. Also blow out onto tissue while leaning forward 45 degrees. Once daily, after a sinus rinse, use sensimist.  Just before bedtime is best. This only needed if allergies acting up.  If this is inadequate add-on once daily for levocetirizine / xyzal 5 mg for nondrowsy antihistamine Take benadryl 25 mg at bedtime also if  allergic mucus is persisting  When allergies cause chronic swelling in sinuses, it leads to sinus infections:    DAILY TREATMENT ROUTINE   Time of Day Treatment Steps  Morning 1. Saline Nasal Spray - Use to cleanse nasal passages 2. Xyzal (levocetirizine) - Take one tablet daily   Throughout Day Saline Nasal Spray - Use 2 additional times (mid-day and afternoon)   Evening/Bedtime 1. Saline Nasal Rinse - Thoroughly clean nasal passages 2. Flonase  Sensimist - Apply after nasal rinse 3. Benadryl (diphenhydramine) - Take 25mg  if experiencing persistent congestion    PROPER TECHNIQUE GUIDE       Saline Nasal Spray/Rinse Technique: Lean forward over sink at a 45-degree angle Turn head slightly to one side Insert spray tip into upper nostril Spray gently while breathing lightly through your nose Repeat on other side Gently blow nose to clear excess solution Use saline spray 3 times daily to keep nasal passages moist and clear allergens.       Flonase  Sensimist Technique: Shake bottle gently before each use Prime the bottle if it's new or hasn't been used for a week Tilt your head forward slightly Insert tip into nostril, pointing away from the center of your nose Spray while inhaling gently Repeat in other nostril Use Flonase  Sensimist once daily, preferably at bedtime after using saline rinse. It may take several days of regular use to feel maximum benefit.   WHY FLONASE  SENSIMIST?   Benefits of Flonase  Sensimist:  Alcohol-free and scent-free formula - gentler on sensitive nasal passages Fine mist application - more comfortable with less dripping down throat Effectively relieves nasal congestion, sneezing, runny nose, and even  eye symptoms 24-hour relief with once-daily dosing Uses a more potent form of fluticasone  that works at a lower dose Less liquid per spray means less discomfort  UNDERSTANDING YOUR MEDICATIONS   Medication How It Works Important Notes  Flonase   Sensimist (fluticasone  furoate) Reduces inflammation in nasal passages, addressing the underlying cause of allergy symptoms - Takes several days for full effect - Use daily for best results - Safe for long-term use   Xyzal (levocetirizine) Blocks histamine to reduce allergy symptoms like sneezing and itching - Take at the same time each day - May cause drowsiness in some people - Once-daily dosing   Benadryl (diphenhydramine) Antihistamine that provides additional relief for breakthrough symptoms - Causes drowsiness - Use only at bedtime - For occasional use when needed   Saline Spray/Rinse Physically removes allergens and moistens nasal passages - Safe to use frequently - Improves effectiveness of other treatments - Reduces nasal irritation    CONTACT YOUR PROVIDER IF: Your symptoms do not improve after 1-2 weeks of following this plan You develop sinus pain with fever or green/yellow discharge You experience frequent nosebleeds You develop new or worsening symptoms You have questions about your treatment plan     ADDITIONAL ALLERGY MANAGEMENT TIPS   HELPFUL STRATEGIES: ?? Keep windows closed during high pollen seasons ??? Use allergen-proof covers for pillows and mattresses ?? Vacuum regularly with a HEPA filter vacuum ?? Shower and change clothes after spending time outdoors ?? Check local pollen counts and limit outdoor time when counts are high ?? Stay well-hydrated to help keep mucous membranes moist

## 2023-12-16 NOTE — Progress Notes (Signed)
 [facility] [address] [place], [place] [address] Office:  [phone]  -- Annual Preventive Medical Office Visit --  Patient:  Jeffrey Glover      Age: 53 y.o.       Sex:  male  Date:   12/16/2023 Patient Care Team: Jesus Bernardino MATSU, MD as PCP - General (Internal Medicine) Today's Healthcare Provider: Bernardino MATSU Jesus, MD  ========================================= Chief complaint: Annual Exam (Pt is present for cpe pt is fasting for labs today.) and foot pain (Left foot pain left heel has been going on for two months now.)  Purpose of Visit: Comprehensive preventive health assessment and personalized health maintenance planning.  This encounter was conducted as a Comprehensive Physical Exam (CPE) preventive care annual visit. The patient's medical history and problem list were reviewed to inform individualized preventive care recommendations.   No significant problem-specific medical treatment was provided during this visit.  Assessment & Plan Encounter for annual general medical examination with abnormal findings in adult General Health Maintenance   Focused on preventive care and screenings. Reviewed vaccination history; noted absence of shingles and hepatitis B vaccinations. Previous Cologuard test was negative; repeat in August 2027. Discussed benefits of using a smartwatch for health monitoring, including detection of atrial fibrillation and sleep apnea. Encourage setting up and using the health app for tracking. Recommend shingles vaccine, preferably from a pharmacy for insurance coverage. Discuss hepatitis B vaccine and assess risk factors. Schedule repeat Cologuard test in August 2027. Encourage use of smartwatch for health monitoring and set up the health app.  Follow-up   Emphasized the need for follow-up appointments and tests to manage conditions effectively. Highlighted the importance of ENT and dermatology follow-ups. Schedule follow-up  appointment for annual physical and blood work. Follow up with ENT and dermatology as per referrals. Prediabetes Prediabetes is indicated by previous lab results. Discussed dietary changes, especially reducing carbohydrate intake, to manage blood glucose levels. Recommended yogurt as a healthier alternative to ice cream for sweet cravings. Recommend dietary changes to reduce carbohydrate intake. Suggest yogurt as a healthier alternative to ice cream. Weight disorder  OSA on CPAP Despite CPAP use, he experiences snoring and hypertension, indicating suboptimal control. Discussed the need for ENT evaluation and possible surgery to enhance CPAP effectiveness. The Inspire device was mentioned as a management option. Zepbound  (tirzepatide ) was discussed for weight loss and sleep apnea management, pending insurance approval. Refer to ENT for evaluation and possible surgery. Discuss the Inspire device as a management option. Consider Zepbound  for weight loss and sleep apnea, pending insurance approval. Screening for malignant neoplasm of prostate  Sun-induced skin changes, keratosis A quarter-sized irregular plaque on the left scalp requires further evaluation by dermatology for diagnosis and management. Refer to dermatology for evaluation of the skin lesion on the left scalp. Photographs Taken 12/17/2023 :    Hypertension (secondary to OSA Uncontrolled hypertension may be linked to sleep apnea. He stopped antihypertensive medication due to side effects. Discussed the connection between sleep apnea and hypertension and the need to manage both to prevent complications. Consider minoxidil  for blood pressure control, noting potential side effects like hair growth. Zepbound  (tirzepatide ) was discussed for weight loss and sleep apnea management, pending insurance approval. Refer to ENT for sleep apnea evaluation and possible surgical intervention. Prescribe low-dose minoxidil  and discuss side effects. Consider  Zepbound  for weight loss and sleep apnea, pending insurance approval. Eosinophilic esophagitis Eosinophilic esophagitis is well-controlled with no current issues. Screening for esophageal cancer showed no  findings. Pain of right heel Its achilles insertion pain likely due to shoes rubbing the heel there- advised patient switch shoes, call for podiatry if doesn't resolve     ICD-10-CM   1. Encounter for annual general medical examination with abnormal findings in adult  Z00.01      Reviewed/updated/encouraged completion: Immunization History  Administered Date(s) Administered   Influenza-Unspecified 02/19/2016   PFIZER(Purple Top)SARS-COV-2 Vaccination 08/15/2019, 09/05/2019   Tdap 02/19/2016   Health Maintenance Due  Topic Date Due   Hepatitis B Vaccines (1 of 3 - 19+ 3-dose series) Never done   Zoster Vaccines- Shingrix  (1 of 2) Never done   Colonoscopy  Never done  Did Cologuard  01/15/2023  Health Maintenance  Topic Date Due   Hepatitis B Vaccines (1 of 3 - 19+ 3-dose series) Never done   Zoster Vaccines- Shingrix  (1 of 2) Never done   Colonoscopy  Never done   INFLUENZA VACCINE  12/23/2023   Hepatitis C Screening  Completed   HIV Screening  Completed   HPV VACCINES  Aged Out   Meningococcal B Vaccine  Aged Out   DTaP/Tdap/Td  Discontinued   COVID-19 Vaccine  Discontinued    Reviewed the following verbally with patient and provided AVS materials: HEALTH MAINTENANCE COUNSELING AND ANTICIPATORY GUIDANCE    Preventive Measure Recommendation  Eye Exams Every 1-2 years  Dental Care Cleanings every 6 months or more, brush/floss 3x daily  Sinus Care Saline spray rinses daily  Sleep 8 hours nightly, good sleep hygiene, e-monitoring if any daytime drowsiness- apple ultra 2 detected snoring but not set up for obstructive sleep apnea yet.  Diet Fruits/vegetables/fiber/healthy fats, balance and moderation Extra Virgin Olive Oil/avocado daily strongly recommended.  Encouraged  And  if they don't work, rice pudding instead of ice cream.   Exercise 150 minutes weekly recommended , he works out and walks 2000 steps.   Risk Behaviors Discouraged any/all high risk behaviors    CANCER SCREENING SHARED DECISION MAKING    Penile/Testicle/Scrotum Encouraged self-monitoring and reporting of genital abnormalities. Patient reports none.  Thyroid Checked and advised to palpate thyroid for nodules  Prostate Individualized risks/benefits/costs discussed No results found for: PSA  Colon HM Colonoscopy         UpToDate based on colguard until 2027.   Lung Current guidelines recommend individuals aged 78 to 77 who currently smoke or formerly smoked and have a >= 20 pack-year smoking history should undergo annual screening with low-dose computed tomography (LDCT). Tobacco Use: Low Risk  (12/16/2023)   Patient History    Smoking Tobacco Use: Never    Smokeless Tobacco Use: Never    Passive Exposure: Not on file   Social History   Tobacco Use  Smoking Status Never  Smokeless Tobacco Never    Skin Advised regular sunscreen use. Patient denies worrisome, changing, or new skin lesions. Offered to include images in chart for surveillance. Showed patient these pictures of melanomas for reference to educate for self-monitoring.  Other Cancers Discussed lack of screening guidelines and insurance coverage for other cancer types.    Discussed the use of AI scribe software for clinical note transcription with the patient, who gave verbal consent to proceed.  History of Present Illness Jeffrey Glover is a 53 year old male who presents for an annual physical exam.  He has a history of frequent sinus infections, previously experiencing up to five per year. Over the past six years, he has noted a significant reduction in these infections, which he  attributes to the nightly use of a saline nasal spray. This has improved his breathing and sleep quality.  He has sleep apnea and uses a CPAP  machine. Despite this, he continues to experience loud snoring. He has visited a sleep doctor but remains unsure of the next steps to improve his condition.  He completed a Cologuard test last year, which was negative for colon cancer. He has not had a colonoscopy.  He has eosinophilic esophagitis, which has been well-controlled without recent issues.  He occasionally uses Tylenol  and Zyrtec , with no other regular medications. He previously stopped taking blood pressure medication due to dissatisfaction with its effects.  He engages in regular physical activity, including gym workouts and walking, but acknowledges needing to increase his daily step count. He aims for 150 minutes of exercise per week, focusing on different muscle groups.  He has a history of prediabetes and is advised to reduce carbohydrate intake. He is allergic to fish and has not tried omega-3 supplements.  He has not received shingles or hepatitis B vaccinations and is unsure about his past vaccination history for these.  He reports a raised spot on his head, which he is concerned about and plans to have evaluated by a dermatologist. No new skin lesions or changes otherwise.    ROS A comprehensive ROS was negative for any concerning symptoms.   Completed medication reconciliation: Current Outpatient Medications on File Prior to Visit  Medication Sig   acetaminophen  (TYLENOL ) 325 MG tablet Take 2 tablets (650 mg total) by mouth every 6 (six) hours as needed for moderate pain (pain score 4-6).   cetirizine  (ZYRTEC  ALLERGY) 10 MG tablet Take 1 tablet (10 mg total) by mouth daily.   amLODipine  (NORVASC ) 10 MG tablet Take 1 tablet (10 mg total) by mouth daily. Start as 0.25 tablets daily for 1 week, then half tablet daily for 1 week (Patient not taking: Reported on 12/16/2023)   amoxicillin -clavulanate (AUGMENTIN ) 875-125 MG tablet Take 1 tablet by mouth 2 (two) times daily. (Patient not taking: Reported on 12/16/2023)    azithromycin  (ZITHROMAX ) 250 MG tablet Day 1: take 2 tablets. Day 2-5: Take 1 tablet daily. (Patient not taking: Reported on 12/16/2023)   buPROPion  (WELLBUTRIN  XL) 150 MG 24 hr tablet Take 1 tablet (150 mg total) by mouth in the morning. (Patient not taking: Reported on 12/16/2023)   losartan  (COZAAR ) 25 MG tablet Take 1 tablet (25 mg total) by mouth daily. (Patient not taking: Reported on 12/16/2023)   metFORMIN  (GLUCOPHAGE ) 500 MG tablet Take 1 tablet (500 mg total) by mouth 2 (two) times daily with a meal. To start: just half a tablet daily for 2 weeks. (Patient not taking: Reported on 12/16/2023)   promethazine -dextromethorphan (PROMETHAZINE -DM) 6.25-15 MG/5ML syrup Take 5 mLs by mouth at bedtime as needed for cough. (Patient not taking: Reported on 12/16/2023)   topiramate  (TOPAMAX ) 25 MG tablet Take 1 tablet (25 mg total) by mouth 2 (two) times daily. To start: just 1 tablet daily for a week (Patient not taking: Reported on 12/16/2023)   No current facility-administered medications on file prior to visit.  There are no discontinued medications.The following were reviewed and/or entered/updated into our electronic MEDICAL RECORD NUMBERPast Medical History:  Diagnosis Date   Dysphagia    Dysuria 10/28/2020   Elevated d-dimer 10/28/2020   17 during severe esophagitis flare Hospitalization 10/2020     Eosinophilic esophagitis    Gastritis without bleeding    Sleep apnea    Urticaria 10/28/2020  Past Surgical History:  Procedure Laterality Date   BIOPSY  10/29/2020   Procedure: BIOPSY;  Surgeon: Albertus Gordy HERO, MD;  Location: [facility];  Service: Gastroenterology;;   ESOPHAGEAL DILATION  10/29/2020   Procedure: ESOPHAGEAL DILATION;  Surgeon: Albertus Gordy HERO, MD;  Location: [facility];  Service: Gastroenterology;;   ESOPHAGOGASTRODUODENOSCOPY (EGD) WITH PROPOFOL  N/A 08/30/2017   Procedure: ESOPHAGOGASTRODUODENOSCOPY (EGD) WITH PROPOFOL ;  Surgeon: Legrand Victory LITTIE DOUGLAS, MD;  Location: [facility];   Service: Gastroenterology;  Laterality: N/A;   ESOPHAGOGASTRODUODENOSCOPY (EGD) WITH PROPOFOL  N/A 10/29/2020   Procedure: ESOPHAGOGASTRODUODENOSCOPY (EGD) WITH PROPOFOL ;  Surgeon: Albertus Gordy HERO, MD;  Location: [facility];  Service: Gastroenterology;  Laterality: N/A;   UPPER GI ENDOSCOPY  08/03/2017   Social History   Socioeconomic History   Marital status: Legally Separated    Spouse name: Not on file   Number of children: 2   Years of education: Not on file   Highest education level: Not on file  Occupational History   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  Tobacco Use   Smoking status: Never   Smokeless tobacco: Never  Vaping Use   Vaping status: Never Used  Substance and Sexual Activity   Alcohol use: Yes    Alcohol/week: 3.0 - 4.0 standard drinks of alcohol    Types: 3 - 4 Cans of beer per week    Comment: occ   Drug use: Never   Sexual activity: Yes    Birth control/protection: None  Other Topics Concern   Not on file  Social History Narrative   Not on file   Social Drivers of Health   Financial Resource Strain: Not on file  Food Insecurity: Not on file  Transportation Needs: Not on file  Physical Activity: Not on file  Stress: Not on file  Social Connections: Not on file  Intimate Partner Violence: Not on file       No data to display         Family History  Problem Relation Age of Onset   Pancreatic cancer Maternal Grandmother    Allergies  Allergen Reactions   Other Anaphylaxis and Other (See Comments)    Fresh fish, can't breath   Social History   Substance and Sexual Activity  Sexual Activity Yes   Birth control/protection: None  @    12/16/2023   10:03 AM  Depression screen PHQ 2/9  Decreased Interest 0  Down, Depressed, Hopeless 0  PHQ - 2 Score 0  Altered sleeping 0  Tired, decreased energy 0  Change in appetite 0  Feeling bad or failure about yourself  0  Trouble concentrating 0  Moving slowly or fidgety/restless 0  Suicidal  thoughts 0  PHQ-9 Score 0  Difficult doing work/chores Not difficult at all      12/09/2022   10:18 AM  Fall Risk   Falls in the past year? 0  Number falls in past yr: 0  Injury with Fall? 0  Risk for fall due to : No Fall Risks  Follow up Falls evaluation completed     BP (!) 156/82   Pulse 84   Temp 98.6 F (37 C) (Temporal)   Ht 6' (1.829 m)   Wt 240 lb (108.9 kg)   SpO2 96%   BMI 32.55 kg/m  BP Readings from Last 3 Encounters:  12/16/23 (!) 156/82  06/01/23 (!) 182/107  12/09/22 (!) 172/100   Wt Readings from Last 10 Encounters:  12/16/23 240 lb (108.9 kg)  12/09/22 243  lb 9.6 oz (110.5 kg)  11/20/20 247 lb 12.8 oz (112.4 kg)  10/29/20 229 lb 15 oz (104.3 kg)  10/12/17 238 lb 6 oz (108.1 kg)  08/30/17 241 lb (109.3 kg)  08/03/17 241 lb (109.3 kg)  07/22/17 241 lb (109.3 kg)  Physical Exam  Physical Exam HEENT: Nasal discharge present, fluid behind left ear. Ears normal. Dentition normal. NECK: Airway tightness in neck. SKIN: Irregular plaque on left scalp, quarter-sized.  GEN: No acute distress, resting comfortably. HEENT: Tympanic membranes normal appearing bilaterally, oropharynx clear, no thyromegaly noted, no palpable lymphadenopathy or thyroid nodules. CARDIOVASCULAR: S1 and S2 heart sounds with regular rate and rhythm, no murmurs appreciated. PULMONARY: Normal work of breathing, clear to auscultation bilaterally, no crackles, wheezes, or rhonchi. ABDOMEN: Soft, nontender, nondistended. MSK: No edema, cyanosis, or clubbing noted. SKIN: Warm, dry, no lesions of concern observed. NEUROLOGICAL: Cranial nerves II-XII grossly intact, strength 5/5 in upper and lower extremities, reflexes symmetric and intact bilaterally. PSYCH: Normal affect and thought content, pleasant and cooperative.  Last CBC Lab Results  Component Value Date   WBC 6.3 12/15/2022   HGB 13.8 12/15/2022   HCT 43.0 12/15/2022   MCV 89.9 12/15/2022   MCH 28.6 11/05/2020   RDW 14.2  12/15/2022   PLT 282.0 12/15/2022   Lab Results  Component Value Date   GLUCOSE 136 (H) 12/15/2022   NA 138 12/15/2022   K 4.1 12/15/2022   CL 106 12/15/2022   CO2 24 12/15/2022   BUN 10 12/15/2022   CREATININE 1.02 12/15/2022   GFR 85.41 12/15/2022   CALCIUM 9.2 12/15/2022   PROT 6.7 12/15/2022   ALBUMIN 4.0 12/15/2022   BILITOT 0.4 12/15/2022   ALKPHOS 50 12/15/2022   AST 19 12/15/2022   ALT 26 12/15/2022   ANIONGAP 9 10/30/2020   Lab Results  Component Value Date   CHOL 154 12/15/2022   HDL 58.30 12/15/2022   LDLCALC 79 12/15/2022   TRIG 84.0 12/15/2022   CHOLHDL 3 12/15/2022   Last hemoglobin A1c Lab Results  Component Value Date   HGBA1C 6.0 12/15/2022   Last thyroid functions Lab Results  Component Value Date   TSH 1.07 12/15/2022   Last vitamin D No results found for: 25OHVITD2, 25OHVITD3, VD25OH Last vitamin B12 and Folate No results found for: VITAMINB12, FOLATE      ======================================  IMPORTANT HEALTH REMINDERS: Report any new or changing skin lesions promptly Maintain recommended screening schedules Discuss any new family history of cancer at future visits Follow up on any new symptoms that persist more than two weeks      Notes:  This document was synthesized by artificial intelligence (Abridge) using HIPAA-compliant recording of the clinical interaction;   We discussed the use of AI scribe software for clinical note transcription with the patient, who gave verbal consent to proceed.    This encounter employed state-of-the-art, real-time, collaborative documentation. The patient was empowered to actively review and assist in updating their electronic medical record on a shared monitor, ensuring transparency and improving accuracy.    Prior to and at the beginning of Comprehensive Physical Exam (CPE) preventive care annual visit appointment types  we clarify to patients Our goal today is to focus on your  preventive or annual Comprehensive Physical Exam (CPE) preventive care annual visit, which typically covers routine screenings and overall health maintenance. However, if you share any new or concerning symptoms--such as dizziness, passing out, severe pain, or anything else that may point to a more serious issue--we  are both legally and ethically required to evaluate it. We cannot simply overlook or ignore such concerns, even if you later decide you don't want to discuss them, because it could jeopardize your health.  If addressing a new concern takes us  beyond the scope of the preventive visit, we may need to bill separately for that portion of care. We understand financial considerations are important, and we're happy to discuss your options if something new comes up. However, we want to be clear that once you mention a potentially serious issue, we must investigate it; we can't ethically or legally exclude that from our records or our evaluation. Please let us  know all of your questions or worries. Together, we can decide how best to manage them and how to minimize any unexpected costs, but we want to keep you safe above all else.   This disclosure is mandated by professional ethics and legal obligations, as healthcare providers must address any substantial health concerns raised during any patient interaction and a comprehensive ROS is required by insurance companies for billing preventive-care visit type.   This disclosure ultimately discourages patients financially from reporting significant health issues.   Medical Screening Exam A medical screening exam (MSE) helps to determine whether you need immediate medical treatment relating to any number of symptoms you are having. This type of exam may be done in an emergency department, an urgent care setting, or your health care provider's office. Depending on your symptoms and severity, you may need additional tests or medical therapy. It is important to  note that an MSE does not necessarily mean that you will need or receive further medical testing or interventions if your symptoms are not deemed to be medically urgent (emergent). Tell a health care provider about: Any allergies you have. All medicines you are taking, including vitamins, herbs, eye drops, creams, and over-the-counter medicines. Any problems you or family members have had with anesthetic medicines. Any bleeding problems you have. Any surgeries you have had. Any medical conditions you have. Whether you are pregnant or may be pregnant. What happens during the test? During the exam, a health care provider does a short, often focused, physical exam and asks about your medical history to assess: Your current symptoms. Your overall health. Your need for possible further medical intervention. What can I expect after the test? If you have a regular health care provider, make an appointment for a follow-up visit with him or her. If you do not have a regular health care provider, ask about resources in your community. Your medical screening exam may determine that: You do not need emergency treatment at this time. You need treatment right away. You need to be transferred to another medical center. This may happen if you need an emergent specialist or consultant that is not available at the medical center you are at. You need to have more tests. A medical specialist may be consulted if needed. Get help right away if: Your condition gets worse. You develop new or troubling symptoms before you see your health care provider. These symptoms may represent a serious problem that is an emergency. Do not wait to see if the symptoms will go away. Get medical help right away. Call your local emergency services (911 in the [place] Do not drive yourself to the hospital. Summary A medical screening exam helps to determine whether you need medical treatment right away. This type of exam may be done  in an emergency department, an urgent care setting, or your  health care provider's office. During the exam, a health care provider does a short physical exam and asks about your current symptoms and overall health. Depending on the exam, more tests or therapies may be ordered. However, an MSE does not necessarily mean that you will have further medical testing if your symptoms are not deemed to be urgent. If you need further care that is not offered at your current medical center, you may need to be transferred to another facility. This information is not intended to replace advice given to you by your health care provider. Make sure you discuss any questions you have with your health care provider. Document Revised: 01/21/2021 Document Reviewed: 09/18/2020 Elsevier Patient Education  2024 Elsevier Inc.   Health Maintenance, Male Adopting a healthy lifestyle and getting preventive care are important in promoting health and wellness. Ask your health care provider about: The right schedule for you to have regular tests and exams. Things you can do on your own to prevent diseases and keep yourself healthy. What should I know about diet, weight, and exercise? Eat a healthy diet  Eat a diet that includes plenty of vegetables, fruits, low-fat dairy products, and lean protein. Do not eat a lot of foods that are high in solid fats, added sugars, or sodium. Maintain a healthy weight Body mass index (BMI) is a measurement that can be used to identify possible weight problems. It estimates body fat based on height and weight. Your health care provider can help determine your BMI and help you achieve or maintain a healthy weight. Get regular exercise Get regular exercise. This is one of the most important things you can do for your health. Most adults should: Exercise for at least 150 minutes each week. The exercise should increase your heart rate and make you sweat (moderate-intensity exercise). Do  strengthening exercises at least twice a week. This is in addition to the moderate-intensity exercise. Spend less time sitting. Even light physical activity can be beneficial. Watch cholesterol and blood lipids Have your blood tested for lipids and cholesterol at 52 years of age, then have this test every 5 years. You may need to have your cholesterol levels checked more often if: Your lipid or cholesterol levels are high. You are older than 52 years of age. You are at high risk for heart disease. What should I know about cancer screening? Many types of cancers can be detected early and may often be prevented. Depending on your health history and family history, you may need to have cancer screening at various ages. This may include screening for: Colorectal cancer. Prostate cancer. Skin cancer. Lung cancer. What should I know about heart disease, diabetes, and high blood pressure? Blood pressure and heart disease High blood pressure causes heart disease and increases the risk of stroke. This is more likely to develop in people who have high blood pressure readings or are overweight. Talk with your health care provider about your target blood pressure readings. Have your blood pressure checked: Every 3-5 years if you are 41-16 years of age. Every year if you are 12 years old or older. If you are between the ages of 40 and 25 and are a current or former smoker, ask your health care provider if you should have a one-time screening for abdominal aortic aneurysm (AAA). Diabetes Have regular diabetes screenings. This checks your fasting blood sugar level. Have the screening done: Once every three years after age 23 if you are at a normal weight and have  a low risk for diabetes. More often and at a younger age if you are overweight or have a high risk for diabetes. What should I know about preventing infection? Hepatitis B If you have a higher risk for hepatitis B, you should be screened for  this virus. Talk with your health care provider to find out if you are at risk for hepatitis B infection. Hepatitis C Blood testing is recommended for: Everyone born from [birth year] through 1964. Anyone with known risk factors for hepatitis C. Sexually transmitted infections (STIs) You should be screened each year for STIs, including gonorrhea and chlamydia, if: You are sexually active and are younger than 52 years of age. You are older than 52 years of age and your health care provider tells you that you are at risk for this type of infection. Your sexual activity has changed since you were last screened, and you are at increased risk for chlamydia or gonorrhea. Ask your health care provider if you are at risk. Ask your health care provider about whether you are at high risk for HIV. Your health care provider may recommend a prescription medicine to help prevent HIV infection. If you choose to take medicine to prevent HIV, you should first get tested for HIV. You should then be tested every 3 months for as long as you are taking the medicine. Follow these instructions at home: Alcohol use Do not drink alcohol if your health care provider tells you not to drink. If you drink alcohol: Limit how much you have to 0-2 drinks a day. Know how much alcohol is in your drink. In the [place] one drink equals one 12 oz bottle of beer (355 mL), one 5 oz glass of wine (148 mL), or one 1 oz glass of hard liquor (44 mL). Lifestyle Do not use any products that contain nicotine or tobacco. These products include cigarettes, chewing tobacco, and vaping devices, such as e-cigarettes. If you need help quitting, ask your health care provider. Do not use street drugs. Do not share needles. Ask your health care provider for help if you need support or information about quitting drugs. General instructions Schedule regular health, dental, and eye exams. Stay current with your vaccines. Tell your health care provider  if: You often feel depressed. You have ever been abused or do not feel safe at home. Summary Adopting a healthy lifestyle and getting preventive care are important in promoting health and wellness. Follow your health care provider's instructions about healthy diet, exercising, and getting tested or screened for diseases. Follow your health care provider's instructions on monitoring your cholesterol and blood pressure. This information is not intended to replace advice given to you by your health care provider. Make sure you discuss any questions you have with your health care provider. Document Revised: 09/29/2020 Document Reviewed: 09/29/2020 Elsevier Patient Education  2024 [employer].

## 2023-12-17 LAB — TSH RFX ON ABNORMAL TO FREE T4: TSH: 0.963 u[IU]/mL (ref 0.450–4.500)

## 2023-12-17 NOTE — Assessment & Plan Note (Signed)
 Uncontrolled hypertension may be linked to sleep apnea. He stopped antihypertensive medication due to side effects. Discussed the connection between sleep apnea and hypertension and the need to manage both to prevent complications. Consider minoxidil  for blood pressure control, noting potential side effects like hair growth. Zepbound  (tirzepatide ) was discussed for weight loss and sleep apnea management, pending insurance approval. Refer to ENT for sleep apnea evaluation and possible surgical intervention. Prescribe low-dose minoxidil  and discuss side effects. Consider Zepbound  for weight loss and sleep apnea, pending insurance approval.

## 2023-12-17 NOTE — Assessment & Plan Note (Signed)
 Eosinophilic esophagitis is well-controlled with no current issues. Screening for esophageal cancer showed no findings.

## 2023-12-17 NOTE — Assessment & Plan Note (Signed)
 Prediabetes is indicated by previous lab results. Discussed dietary changes, especially reducing carbohydrate intake, to manage blood glucose levels. Recommended yogurt as a healthier alternative to ice cream for sweet cravings. Recommend dietary changes to reduce carbohydrate intake. Suggest yogurt as a healthier alternative to ice cream.

## 2023-12-17 NOTE — Assessment & Plan Note (Signed)
 Despite CPAP use, he experiences snoring and hypertension, indicating suboptimal control. Discussed the need for ENT evaluation and possible surgery to enhance CPAP effectiveness. The Inspire device was mentioned as a management option. Zepbound  (tirzepatide ) was discussed for weight loss and sleep apnea management, pending insurance approval. Refer to ENT for evaluation and possible surgery. Discuss the Inspire device as a management option. Consider Zepbound  for weight loss and sleep apnea, pending insurance approval.

## 2023-12-18 ENCOUNTER — Ambulatory Visit: Payer: Self-pay | Admitting: Internal Medicine

## 2023-12-18 DIAGNOSIS — R7303 Prediabetes: Secondary | ICD-10-CM

## 2023-12-27 ENCOUNTER — Encounter (INDEPENDENT_AMBULATORY_CARE_PROVIDER_SITE_OTHER): Payer: Self-pay

## 2024-01-26 ENCOUNTER — Institutional Professional Consult (permissible substitution) (INDEPENDENT_AMBULATORY_CARE_PROVIDER_SITE_OTHER): Admitting: Otolaryngology

## 2024-01-26 NOTE — Progress Notes (Deleted)
 ENT CONSULT:  Reason for Consult: ***   HPI: Discussed the use of AI scribe software for clinical note transcription with the patient, who gave verbal consent to proceed.  History of Present Illness      Records Reviewed:  Sleep Medicine Dr Therisa office visit 01/30/2019 OSA (obstructive sleep apnea) - Ambulatory Referral To Sleep Clinic  Recommended gradual exercise and weight reduction. Sleep study ordered to rule obstructive sleep apnea. If positive patient would benefit from CPAP. Patient will return for physical in the near future. 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   PCP Dr Jesus note   OSA on CPAP Despite CPAP use, he experiences snoring and hypertension, indicating suboptimal control. Discussed the need for ENT evaluation and possible surgery to enhance CPAP effectiveness. The Inspire device was mentioned as a management option. Zepbound  (tirzepatide ) was discussed for weight loss and sleep apnea management, pending insurance approval. Refer to ENT for evaluation and possible surgery. Discuss the Inspire device as a management option. Consider Zepbound  for weight loss and sleep apnea, pending insurance approval.  Hx of EoE - well controlled  Past Medical History:  Diagnosis Date   Dysphagia    Dysuria 10/28/2020   Elevated d-dimer 10/28/2020   18 during severe esophagitis flare Hospitalization 10/2020     Eosinophilic esophagitis    Gastritis without bleeding    Sleep apnea    Urticaria 10/28/2020    Past Surgical History:  Procedure Laterality Date   BIOPSY  10/29/2020   Procedure: BIOPSY;  Surgeon: Albertus Gordy HERO, MD;  Location: [facility];  Service: Gastroenterology;;   ESOPHAGEAL DILATION  10/29/2020   Procedure: ESOPHAGEAL DILATION;  Surgeon: Albertus Gordy HERO, MD;  Location: [facility];  Service: Gastroenterology;;   ESOPHAGOGASTRODUODENOSCOPY (EGD) WITH PROPOFOL  N/A 08/30/2017   Procedure:  ESOPHAGOGASTRODUODENOSCOPY (EGD) WITH PROPOFOL ;  Surgeon: Legrand Victory LITTIE DOUGLAS, MD;  Location: [facility];  Service: Gastroenterology;  Laterality: N/A;   ESOPHAGOGASTRODUODENOSCOPY (EGD) WITH PROPOFOL  N/A 10/29/2020   Procedure: ESOPHAGOGASTRODUODENOSCOPY (EGD) WITH PROPOFOL ;  Surgeon: Albertus Gordy HERO, MD;  Location: [facility];  Service: Gastroenterology;  Laterality: N/A;   UPPER GI ENDOSCOPY  08/03/2017    Family History  Problem Relation Age of Onset   Pancreatic cancer Maternal Grandmother     Social History:  reports that he has never smoked. He has never used smokeless tobacco. He reports current alcohol use of about 3.0 - 4.0 standard drinks of alcohol per week. He reports that he does not use drugs.  Allergies:  Allergies  Allergen Reactions   Other Anaphylaxis and Other (See Comments)    Fresh fish, can't breath    Medications: I have reviewed the patient's current medications.  The PMH, PSH, Medications, Allergies, and SH were reviewed and updated.  ROS: Constitutional: Negative for fever, weight loss and weight gain. Cardiovascular: Negative for chest pain and dyspnea on exertion. Respiratory: Is not experiencing shortness of breath at rest. Gastrointestinal: Negative for nausea and vomiting. Neurological: Negative for headaches. Psychiatric: The patient is not nervous/anxious  There were no vitals taken for this visit. There is no height or weight on file to calculate BMI.  PHYSICAL EXAM:  Exam: General: Well-developed, well-nourished Communication and Voice: Clear pitch and clarity Respiratory Respiratory effort: Equal inspiration and expiration without stridor Cardiovascular Peripheral Vascular: Warm extremities with equal color/perfusion Eyes: No nystagmus with equal extraocular motion bilaterally Neuro/Psych/Balance: Patient oriented to person, place, and time; Appropriate mood and  affect; Gait is intact with no imbalance; Cranial nerves I-XII are  intact Head and Face Inspection: Normocephalic and atraumatic without mass or lesion Palpation: Facial skeleton intact without bony stepoffs Salivary Glands: No mass or tenderness Facial Strength: Facial motility symmetric and full bilaterally ENT Pinna: External ear intact and fully developed External canal: Canal is patent with intact skin Tympanic Membrane: Clear and mobile External Nose: No scar or anatomic deformity Internal Nose: Septum is ***. No polyp, or purulence. Mucosal edema and erythema present.  Bilateral inferior turbinate hypertrophy.  Lips, Teeth, and gums: Mucosa and teeth intact and viable TMJ: No pain to palpation with full mobility Oral cavity/oropharynx: No erythema or exudate, no lesions present Nasopharynx: No mass or lesion with intact mucosa Hypopharynx: Intact mucosa without pooling of secretions Larynx Glottic: Full true vocal cord mobility without lesion or mass Supraglottic: Normal appearing epiglottis and AE folds Interarytenoid Space: Moderate pachydermia&edema Subglottic Space: Patent without lesion or edema Neck Neck and Trachea: Midline trachea without mass or lesion Thyroid: No mass or nodularity Lymphatics: No lymphadenopathy  Procedure: {lsofficescopes (Optional):32311}     Studies Reviewed:***  Assessment/Plan: No diagnosis found.  Assessment and Plan Assessment & Plan       Thank you for allowing me to participate in the care of this patient. Please do not hesitate to contact me with any questions or concerns.   Elena Larry, MD Otolaryngology [facility] Phone: [phone] Fax: [phone]    01/26/2024, 8:09 AM

## 2024-02-20 ENCOUNTER — Ambulatory Visit: Admitting: Dermatology

## 2024-04-25 ENCOUNTER — Encounter: Payer: Self-pay | Admitting: Internal Medicine

## 2024-04-25 DIAGNOSIS — G4733 Obstructive sleep apnea (adult) (pediatric): Secondary | ICD-10-CM

## 2024-04-25 DIAGNOSIS — I159 Secondary hypertension, unspecified: Secondary | ICD-10-CM

## 2024-04-25 DIAGNOSIS — T7840XD Allergy, unspecified, subsequent encounter: Secondary | ICD-10-CM

## 2024-04-25 MED ORDER — MINOXIDIL 2.5 MG PO TABS: 2.5000 mg | ORAL_TABLET | Freq: Every day | ORAL | 3 refills | Status: AC

## 2024-04-25 MED ORDER — CETIRIZINE HCL 10 MG PO TABS: 10.0000 mg | ORAL_TABLET | Freq: Every day | ORAL | 4 refills | Status: AC

## 2024-04-25 MED ORDER — TIRZEPATIDE-WEIGHT MANAGEMENT 2.5 MG/0.5ML ~~LOC~~ SOAJ: 2.5000 mg | SUBCUTANEOUS | 11 refills | Status: AC

## 2024-06-25 ENCOUNTER — Telehealth: Admitting: Emergency Medicine

## 2024-06-25 DIAGNOSIS — B9689 Other specified bacterial agents as the cause of diseases classified elsewhere: Secondary | ICD-10-CM

## 2024-06-25 MED ORDER — AMOXICILLIN-POT CLAVULANATE 875-125 MG PO TABS: 1.0000 | ORAL_TABLET | Freq: Two times a day (BID) | ORAL | 0 refills | Status: AC

## 2024-06-25 NOTE — Progress Notes (Signed)
 E-Visit for Sinus Problems  We are sorry that you are not feeling well.  Here is how we plan to help!  Based on what you have shared with me it looks like you have sinusitis.  Sinusitis is inflammation and infection in the sinus cavities of the head.  Based on your presentation I believe you most likely have Acute Bacterial Sinusitis.  This is an infection caused by bacteria and is treated with antibiotics. I have prescribed Augmentin  875mg /125mg  one tablet twice daily with food, for 7 days.   You may use an oral decongestant such as Mucinex D or if you have glaucoma or high blood pressure use plain Mucinex. Saline nasal spray help and can safely be used as often as needed for congestion. Try using saline irrigation, such as with a neti pot, several times a day while you are sick. Many neti pots come with salt packets premeasured to use to make saline. If you use your own salt, make sure it is kosher salt or sea salt (don't use table salt as it has iodine in it and you don't need that in your nose). Use distilled water to make saline. If you mix your own saline using your own salt, the recipe is 1/4 teaspoon salt in 1 cup warm water. Using saline irrigation can help prevent and treat sinus infections.   If you develop worsening sinus pain, fever or notice severe headache and vision changes, or if symptoms are not better after completion of antibiotic, please schedule an appointment with a health care provider.    Sinus infections are not as easily transmitted as other respiratory infection, however we still recommend that you avoid close contact with loved ones, especially the very young and elderly.  Remember to wash your hands thoroughly throughout the day as this is the number one way to prevent the spread of infection!  Home Care: Only take medications as instructed by your medical team. Complete the entire course of an antibiotic. Do not take these medications with alcohol. A steam or  ultrasonic humidifier can help congestion.  You can place a towel over your head and breathe in the steam from hot water coming from a faucet. Avoid close contacts especially the very young and the elderly. Cover your mouth when you cough or sneeze. Always remember to wash your hands.  Get Help Right Away If: You develop worsening fever or sinus pain. You develop a severe head ache or visual changes. Your symptoms persist after you have completed your treatment plan.  Make sure you Understand these instructions. Will watch your condition. Will get help right away if you are not doing well or get worse.  Your e-visit answers were reviewed by a board certified advanced clinical practitioner to complete your personal care plan.  Depending on the condition, your plan could have included both over the counter or prescription medications.  If there is a problem please reply  once you have received a response from your provider.  Your safety is important to us .  If you have drug allergies check your prescription carefully.    You can use MyChart to ask questions about today's visit, request a non-urgent call back, or ask for a work or school excuse for 24 hours related to this e-Visit. If it has been greater than 24 hours you will need to follow up with your provider, or enter a new e-Visit to address those concerns.  You will get an e-mail in the next two days asking  about your experience.  I hope that your e-visit has been valuable and will speed your recovery. Thank you for using e-visits.  I have spent 5 minutes in review of e-visit questionnaire, review and updating patient chart, medical decision making and response to patient.   Jon Belt, PhD, FNP-BC

## 2024-11-26 ENCOUNTER — Ambulatory Visit: Payer: Self-pay | Admitting: Physician Assistant

## 2024-12-21 ENCOUNTER — Encounter: Admitting: Internal Medicine
